# Patient Record
Sex: Female | Born: 1990 | Race: Black or African American | Hispanic: No | Marital: Single | Smoking: Never smoker
Health system: Southern US, Community
[De-identification: ages and names within clinical notes are randomized; demographics above are authoritative.]

## PROBLEM LIST (undated history)

## (undated) DIAGNOSIS — F419 Anxiety disorder, unspecified: Secondary | ICD-10-CM

## (undated) DIAGNOSIS — E039 Hypothyroidism, unspecified: Secondary | ICD-10-CM

## (undated) DIAGNOSIS — E559 Vitamin D deficiency, unspecified: Secondary | ICD-10-CM

## (undated) DIAGNOSIS — N83209 Unspecified ovarian cyst, unspecified side: Secondary | ICD-10-CM

## (undated) DIAGNOSIS — D219 Benign neoplasm of connective and other soft tissue, unspecified: Secondary | ICD-10-CM

## (undated) DIAGNOSIS — E785 Hyperlipidemia, unspecified: Secondary | ICD-10-CM

## (undated) HISTORY — DX: Benign neoplasm of connective and other soft tissue, unspecified: D21.9

## (undated) HISTORY — DX: Vitamin D deficiency, unspecified: E55.9

## (undated) HISTORY — DX: Anxiety disorder, unspecified: F41.9

## (undated) HISTORY — DX: Hypothyroidism, unspecified: E03.9

## (undated) HISTORY — DX: Hyperlipidemia, unspecified: E78.5

---

## 2019-09-01 HISTORY — PX: MYOMECTOMY: SHX85

## 2019-12-12 ENCOUNTER — Encounter (HOSPITAL_BASED_OUTPATIENT_CLINIC_OR_DEPARTMENT_OTHER): Payer: Self-pay | Admitting: *Deleted

## 2019-12-12 ENCOUNTER — Emergency Department (HOSPITAL_BASED_OUTPATIENT_CLINIC_OR_DEPARTMENT_OTHER)
Admission: EM | Admit: 2019-12-12 | Discharge: 2019-12-13 | Disposition: A | Discharge status: Home / Self Care | Payer: Self-pay | Attending: Emergency Medicine | Admitting: Emergency Medicine

## 2019-12-12 ENCOUNTER — Emergency Department (HOSPITAL_BASED_OUTPATIENT_CLINIC_OR_DEPARTMENT_OTHER): Payer: Self-pay

## 2019-12-12 ENCOUNTER — Other Ambulatory Visit: Payer: Self-pay

## 2019-12-12 DIAGNOSIS — D259 Leiomyoma of uterus, unspecified: Secondary | ICD-10-CM | POA: Insufficient documentation

## 2019-12-12 DIAGNOSIS — R102 Pelvic and perineal pain: Secondary | ICD-10-CM

## 2019-12-12 DIAGNOSIS — D219 Benign neoplasm of connective and other soft tissue, unspecified: Secondary | ICD-10-CM

## 2019-12-12 DIAGNOSIS — N946 Dysmenorrhea, unspecified: Secondary | ICD-10-CM | POA: Insufficient documentation

## 2019-12-12 HISTORY — DX: Unspecified ovarian cyst, unspecified side: N83.209

## 2019-12-12 LAB — URINALYSIS, MICROSCOPIC (REFLEX)

## 2019-12-12 LAB — URINALYSIS, ROUTINE W REFLEX MICROSCOPIC
Bilirubin Urine: NEGATIVE
Glucose, UA: NEGATIVE mg/dL
Ketones, ur: NEGATIVE mg/dL
Leukocytes,Ua: NEGATIVE
Nitrite: NEGATIVE
Protein, ur: NEGATIVE mg/dL
Specific Gravity, Urine: 1.025 (ref 1.005–1.030)
pH: 6 (ref 5.0–8.0)

## 2019-12-12 LAB — PREGNANCY, URINE: Preg Test, Ur: NEGATIVE

## 2019-12-12 MED ORDER — IBUPROFEN 800 MG PO TABS
800.0000 mg | ORAL_TABLET | Freq: Once | ORAL | Status: AC
  Administered 2019-12-12: 800 mg via ORAL
  Filled 2019-12-12: qty 1

## 2019-12-12 MED ORDER — ACETAMINOPHEN 500 MG PO TABS
1000.0000 mg | ORAL_TABLET | Freq: Once | ORAL | Status: AC
  Administered 2019-12-12: 1000 mg via ORAL
  Filled 2019-12-12: qty 2

## 2019-12-12 NOTE — ED Triage Notes (Signed)
Pt c/o left abd cramping x 3 days , HX ovarian cyst

## 2019-12-13 MED ORDER — IBUPROFEN 800 MG PO TABS: 800.0000 mg | ORAL_TABLET | Freq: Three times a day (TID) | ORAL | 0 refills | Status: DC

## 2019-12-13 NOTE — ED Provider Notes (Signed)
[facility] Provider Note   CSN: [id number] Arrival date & time: 12/12/19  2117     History Chief Complaint  Patient presents with  . Abdominal Cramping    Margaret Franklin is a 29 y.o. female.  The history is provided by the patient.  Abdominal Cramping This is a new problem. The current episode started more than 2 days ago. The problem occurs constantly. The problem has not changed since onset.Pertinent negatives include no chest pain, no abdominal pain, no headaches and no shortness of breath. Nothing aggravates the symptoms. Nothing relieves the symptoms. She has tried nothing for the symptoms. The treatment provided no relief.  Pelvic pain and cramping since starting her menstrual cycle.  No f/c/r.  No discharge.       Past Medical History:  Diagnosis Date  . Ovarian cyst     There are no problems to display for this patient.   History reviewed. No pertinent surgical history.   OB History   No obstetric history on file.     No family history on file.  Social History   Tobacco Use  . Smoking status: Never Smoker  Substance Use Topics  . Alcohol use: Not Currently  . Drug use: Not Currently    Home Medications Prior to Admission medications   Medication Sig Start Date End Date Taking? Authorizing Provider  ibuprofen (ADVIL) 800 MG tablet Take 1 tablet (800 mg total) by mouth 3 (three) times daily. 12/13/19   Stayce Delancy, MD    Allergies    Patient has no known allergies.  Review of Systems   Review of Systems  Constitutional: Negative for fever.  HENT: Negative for congestion.   Eyes: Negative for visual disturbance.  Respiratory: Negative for shortness of breath.   Cardiovascular: Negative for chest pain.  Gastrointestinal: Negative for abdominal pain.  Genitourinary: Positive for pelvic pain and vaginal bleeding. Negative for vaginal discharge.  Musculoskeletal: Negative for back pain.  Neurological: Negative for  headaches.  Psychiatric/Behavioral: Negative for agitation.  All other systems reviewed and are negative.   Physical Exam Updated Vital Signs BP 125/70 (BP Location: Right Arm)   Pulse (!) 108   Temp 98.2 F (36.8 C) (Oral)   Resp 19   Ht 5\' 6"  (1.676 m)   Wt 59 kg   LMP 12/10/2019   SpO2 96%   BMI 20.98 kg/m   Physical Exam Vitals and nursing note reviewed.  Constitutional:      Appearance: Normal appearance. She is not ill-appearing.  HENT:     Head: Normocephalic.     Nose: Nose normal.  Eyes:     Conjunctiva/sclera: Conjunctivae normal.     Pupils: Pupils are equal, round, and reactive to light.  Cardiovascular:     Rate and Rhythm: Normal rate and regular rhythm.     Pulses: Normal pulses.     Heart sounds: Normal heart sounds.  Pulmonary:     Effort: Pulmonary effort is normal.     Breath sounds: Normal breath sounds.  Abdominal:     General: Abdomen is flat. Bowel sounds are normal.     Tenderness: There is abdominal tenderness. There is no guarding or rebound.     Hernia: No hernia is present.  Genitourinary:    General: Normal vulva.     Comments: Chaperone present scant bleeding no CMT Musculoskeletal:        General: Normal range of motion.     Cervical back: Normal range of  motion and neck supple.  Skin:    General: Skin is warm and dry.     Capillary Refill: Capillary refill takes less than 2 seconds.  Neurological:     General: No focal deficit present.     Mental Status: She is alert and oriented to person, place, and time.  Psychiatric:        Mood and Affect: Mood normal.        Behavior: Behavior normal.     ED Results / Procedures / Treatments   Labs (all labs ordered are listed, but only abnormal results are displayed) Results for orders placed or performed during the hospital encounter of 12/12/19  Urinalysis, Routine w reflex microscopic  Result Value Ref Range   Color, Urine YELLOW YELLOW   APPearance CLEAR CLEAR   Specific  Gravity, Urine 1.025 1.005 - 1.030   pH 6.0 5.0 - 8.0   Glucose, UA NEGATIVE NEGATIVE mg/dL   Hgb urine dipstick LARGE (A) NEGATIVE   Bilirubin Urine NEGATIVE NEGATIVE   Ketones, ur NEGATIVE NEGATIVE mg/dL   Protein, ur NEGATIVE NEGATIVE mg/dL   Nitrite NEGATIVE NEGATIVE   Leukocytes,Ua NEGATIVE NEGATIVE  Pregnancy, urine  Result Value Ref Range   Preg Test, Ur NEGATIVE NEGATIVE  Urinalysis, Microscopic (reflex)  Result Value Ref Range   RBC / HPF 21-50 0 - 5 RBC/hpf   WBC, UA 0-5 0 - 5 WBC/hpf   Bacteria, UA FEW (A) NONE SEEN   Squamous Epithelial / LPF 0-5 0 - 5   Mucus PRESENT    US PELVIC COMPLETE W TRANSVAGINAL AND TORSION R/O  Result Date: 12/12/2019 CLINICAL DATA:  Pelvic cramping with left pelvic pain x1 week. EXAM: TRANSABDOMINAL AND TRANSVAGINAL ULTRASOUND OF PELVIS TECHNIQUE: Both transabdominal and transvaginal ultrasound examinations of the pelvis were performed. Transabdominal technique was performed for global imaging of the pelvis including uterus, ovaries, adnexal regions, and pelvic cul-de-sac. It was necessary to proceed with endovaginal exam following the transabdominal exam to visualize the uterus, endometrium and bilateral ovaries. COMPARISON:  None FINDINGS: Uterus Measurements: 7.8 cm x 4.8 cm x 5.5 cm = volume: 108 mL. A 4.7 cm x 3.8 cm x 3.7 cm heterogeneous echogenic mass is seen within the anterior aspect of the uterus on the left. No abnormal flow is seen within this region on color Doppler evaluation. Endometrium Thickness: 4.6 mm.  No focal abnormality visualized. Right ovary Measurements: 5.1 cm x 2.5 cm x 3.0 cm = volume: 19.8 mL. Multiple right ovarian cysts of various sizes are seen (ultrasonographic measurements not provided). Left ovary Measurements: 3.4 cm x 1.7 cm x 1.7 cm = volume: 5.2 mL. Normal appearance/no adnexal mass. Other findings A small amount of pelvic free fluid is seen. IMPRESSION: Large heterogeneous uterine mass which may represent a large  uterine fibroid. Further evaluation with pelvic MRI is recommended. Electronically Signed   By: Virgina Norfolk M.D.   On: 12/12/2019 22:23    Radiology US PELVIC COMPLETE W TRANSVAGINAL AND TORSION R/O  Result Date: 12/12/2019 CLINICAL DATA:  Pelvic cramping with left pelvic pain x1 week. EXAM: TRANSABDOMINAL AND TRANSVAGINAL ULTRASOUND OF PELVIS TECHNIQUE: Both transabdominal and transvaginal ultrasound examinations of the pelvis were performed. Transabdominal technique was performed for global imaging of the pelvis including uterus, ovaries, adnexal regions, and pelvic cul-de-sac. It was necessary to proceed with endovaginal exam following the transabdominal exam to visualize the uterus, endometrium and bilateral ovaries. COMPARISON:  None FINDINGS: Uterus Measurements: 7.8 cm x 4.8 cm x 5.5 cm =  volume: 108 mL. A 4.7 cm x 3.8 cm x 3.7 cm heterogeneous echogenic mass is seen within the anterior aspect of the uterus on the left. No abnormal flow is seen within this region on color Doppler evaluation. Endometrium Thickness: 4.6 mm.  No focal abnormality visualized. Right ovary Measurements: 5.1 cm x 2.5 cm x 3.0 cm = volume: 19.8 mL. Multiple right ovarian cysts of various sizes are seen (ultrasonographic measurements not provided). Left ovary Measurements: 3.4 cm x 1.7 cm x 1.7 cm = volume: 5.2 mL. Normal appearance/no adnexal mass. Other findings A small amount of pelvic free fluid is seen. IMPRESSION: Large heterogeneous uterine mass which may represent a large uterine fibroid. Further evaluation with pelvic MRI is recommended. Electronically Signed   By: Virgina Norfolk M.D.   On: 12/12/2019 22:23    Procedures Procedures (including critical care time)  Medications Ordered in ED Medications  acetaminophen (TYLENOL) tablet 1,000 mg (1,000 mg Oral Given 12/12/19 2355)  ibuprofen (ADVIL) tablet 800 mg (800 mg Oral Given 12/12/19 2355)    ED Course  I have reviewed the triage vital signs and  the nursing notes.  Pertinent labs & imaging results that were available during my care of the patient were reviewed by me and considered in my medical decision making (see chart for details).    Cramping is likely secondary to period as well as fibroid seen on [place].  I have explained that the radiologist recommends follow up with GYN and pelvic MRI to better characterize this lesion.  I have also printed this information on her discharge paperwork.  Patient verbalizes understanding.  Margaret Franklin was evaluated in Emergency Department on 12/13/2019 for the symptoms described in the history of present illness. She was evaluated in the context of the global COVID-19 pandemic, which necessitated consideration that the patient might be at risk for infection with the SARS-CoV-2 virus that causes COVID-19. Institutional protocols and algorithms that pertain to the evaluation of patients at risk for COVID-19 are in a state of rapid change based on information released by regulatory bodies including the CDC and federal and state organizations. These policies and algorithms were followed during the patient's care in the ED.    Final Clinical Impression(s) / ED Diagnoses Final diagnoses:  Fibroid  Dysmenorrhea   Return for weakness, numbness, changes in vision or speech, fevers >100.4 unrelieved by medication, shortness of breath, intractable vomiting, or diarrhea, abdominal pain, Inability to tolerate liquids or food, cough, altered mental status or any concerns. No signs of systemic illness or infection. The patient is nontoxic-appearing on exam and vital signs are within normal limits.   I have reviewed the triage vital signs and the nursing notes. Pertinent labs &imaging results that were available during my care of the patient were reviewed by me and considered in my medical decision making (see chart for details).  After history, exam, and medical workup I feel the patient has been appropriately  medically screened and is safe for discharge home. Pertinent diagnoses were discussed with the patient. Patient was givenstrictreturn precautions. Rx / DC Orders ED Discharge Orders         Ordered    ibuprofen (ADVIL) 800 MG tablet  3 times daily     12/13/19 0037           Hannia Matchett, MD 12/13/19 [id number]

## 2019-12-13 NOTE — ED Notes (Signed)
PT instructed on how to f/u with MRI of uterus at [facility] clinic. PT verbalized understanding.

## 2020-01-01 ENCOUNTER — Emergency Department (HOSPITAL_BASED_OUTPATIENT_CLINIC_OR_DEPARTMENT_OTHER)
Admission: EM | Admit: 2020-01-01 | Discharge: 2020-01-01 | Disposition: A | Discharge status: Home / Self Care | Payer: BLUE CROSS/BLUE SHIELD | Attending: Emergency Medicine | Admitting: Emergency Medicine

## 2020-01-01 ENCOUNTER — Emergency Department (HOSPITAL_BASED_OUTPATIENT_CLINIC_OR_DEPARTMENT_OTHER): Payer: BLUE CROSS/BLUE SHIELD

## 2020-01-01 ENCOUNTER — Encounter (HOSPITAL_BASED_OUTPATIENT_CLINIC_OR_DEPARTMENT_OTHER): Payer: Self-pay

## 2020-01-01 ENCOUNTER — Other Ambulatory Visit: Payer: Self-pay

## 2020-01-01 DIAGNOSIS — R102 Pelvic and perineal pain: Secondary | ICD-10-CM | POA: Diagnosis not present

## 2020-01-01 DIAGNOSIS — M549 Dorsalgia, unspecified: Secondary | ICD-10-CM | POA: Insufficient documentation

## 2020-01-01 DIAGNOSIS — R109 Unspecified abdominal pain: Secondary | ICD-10-CM | POA: Diagnosis present

## 2020-01-01 LAB — CBC WITH DIFFERENTIAL/PLATELET
Abs Immature Granulocytes: 0 10*3/uL (ref 0.00–0.07)
Basophils Absolute: 0 10*3/uL (ref 0.0–0.1)
Basophils Relative: 1 %
Eosinophils Absolute: 0 10*3/uL (ref 0.0–0.5)
Eosinophils Relative: 1 %
HCT: 36 % (ref 36.0–46.0)
Hemoglobin: 11.5 g/dL — ABNORMAL LOW (ref 12.0–15.0)
Immature Granulocytes: 0 %
Lymphocytes Relative: 41 %
Lymphs Abs: 1.4 10*3/uL (ref 0.7–4.0)
MCH: 25.2 pg — ABNORMAL LOW (ref 26.0–34.0)
MCHC: 31.9 g/dL (ref 30.0–36.0)
MCV: 78.9 fL — ABNORMAL LOW (ref 80.0–100.0)
Monocytes Absolute: 0.4 10*3/uL (ref 0.1–1.0)
Monocytes Relative: 13 %
Neutro Abs: 1.4 10*3/uL — ABNORMAL LOW (ref 1.7–7.7)
Neutrophils Relative %: 44 %
Platelets: 354 10*3/uL (ref 150–400)
RBC: 4.56 MIL/uL (ref 3.87–5.11)
RDW: 14 % (ref 11.5–15.5)
WBC: 3.3 10*3/uL — ABNORMAL LOW (ref 4.0–10.5)
nRBC: 0 % (ref 0.0–0.2)

## 2020-01-01 LAB — URINALYSIS, MICROSCOPIC (REFLEX)

## 2020-01-01 LAB — URINALYSIS, ROUTINE W REFLEX MICROSCOPIC
Bilirubin Urine: NEGATIVE
Glucose, UA: NEGATIVE mg/dL
Ketones, ur: NEGATIVE mg/dL
Leukocytes,Ua: NEGATIVE
Nitrite: NEGATIVE
Protein, ur: 30 mg/dL — AB
Specific Gravity, Urine: 1.02 (ref 1.005–1.030)
pH: 7.5 (ref 5.0–8.0)

## 2020-01-01 LAB — BASIC METABOLIC PANEL
Anion gap: 10 (ref 5–15)
BUN: 7 mg/dL (ref 6–20)
CO2: 23 mmol/L (ref 22–32)
Calcium: 9.3 mg/dL (ref 8.9–10.3)
Chloride: 105 mmol/L (ref 98–111)
Creatinine, Ser: 0.67 mg/dL (ref 0.44–1.00)
GFR calc Af Amer: >60 mL/min (ref >60)
GFR calc non Af Amer: >60 mL/min (ref >60)
Glucose, Bld: 94 mg/dL (ref 70–99)
Potassium: 3.6 mmol/L (ref 3.5–5.1)
Sodium: 138 mmol/L (ref 135–145)

## 2020-01-01 LAB — PREGNANCY, URINE: Preg Test, Ur: NEGATIVE

## 2020-01-01 MED ORDER — MORPHINE SULFATE (PF) 4 MG/ML IV SOLN
4.0000 mg | Freq: Once | INTRAVENOUS | Status: AC
  Administered 2020-01-01: 13:00:00 4 mg via INTRAVENOUS
  Filled 2020-01-01: qty 1

## 2020-01-01 MED ORDER — KETOROLAC TROMETHAMINE 30 MG/ML IJ SOLN
30.0000 mg | Freq: Once | INTRAMUSCULAR | Status: AC
  Administered 2020-01-01: 13:00:00 30 mg via INTRAVENOUS
  Filled 2020-01-01: qty 1

## 2020-01-01 MED ORDER — HYDROCODONE-ACETAMINOPHEN 5-325 MG PO TABS: 1.0000 | ORAL_TABLET | Freq: Four times a day (QID) | ORAL | 0 refills | Status: DC | PRN

## 2020-01-01 NOTE — ED Triage Notes (Signed)
Pt arrives with abdominal cramping has recently been seen here and did follow up with [facility]. Pt has apt tomorrow but states the pain is not getting better. She has stopped taking her birth control to try and ease pain, has been using tylenol, and ibuprofen with no relief. Pt reports she has also had irregularity in her menstrual cycle.

## 2020-01-01 NOTE — Discharge Instructions (Addendum)
You were seen in the emergency department for worsening pelvic pain.  You had a CAT scan along with blood work and urinalysis that did not show any serious findings.  Please follow-up with your gynecologist tomorrow as scheduled.  We are providing you a short course of some pain medicine.  Return to the emergency department if any worsening or concerning symptoms

## 2020-01-01 NOTE — ED Provider Notes (Signed)
[facility] EMERGENCY DEPARTMENT Provider Note   CSN: [id number] Arrival date & time: 01/01/20  1204     History Chief Complaint  Patient presents with  . Abdominal Pain    Margaret Franklin is a 29 y.o. female.  She has a history of chronic pelvic pain that is been going on for for 5 years.  She was here last month for same and had an ultrasound that showed a uterine fibroid.  She is followed up with GYN and actually has an appointment again with them tomorrow.  She is using Tylenol and ibuprofen without improvement.  Complaining of stabbing severe low abdominal pain radiating through to her back mostly on the left side.  Not associate with any urinary symptoms.  Not having any vaginal bleeding or discharge now.  Stopped her oral contraceptives without any improvement in her symptoms.  She said she gets this pain all the time but it is worse when she has her cycle.  The history is provided by the patient.  Abdominal Pain Pain location:  Suprapubic and LLQ Pain quality: stabbing   Pain radiates to:  Back Pain severity:  Severe Onset quality:  Gradual Timing:  Intermittent Progression:  Unchanged Chronicity:  Chronic Context: not recent travel, not sick contacts and not trauma   Relieved by:  Nothing Worsened by:  Nothing Ineffective treatments:  Acetaminophen and NSAIDs Associated symptoms: no chest pain, no constipation, no cough, no diarrhea, no dysuria, no fever, no hematemesis, no hematochezia, no hematuria, no nausea, no shortness of breath, no sore throat, no vaginal bleeding, no vaginal discharge and no vomiting        Past Medical History:  Diagnosis Date  . Ovarian cyst     There are no problems to display for this patient.   History reviewed. No pertinent surgical history.   OB History   No obstetric history on file.     No family history on file.  Social History   Tobacco Use  . Smoking status: Never Smoker  . Smokeless tobacco: Never Used    Substance Use Topics  . Alcohol use: Not Currently  . Drug use: Not Currently    Home Medications Prior to Admission medications   Medication Sig Start Date End Date Taking? Authorizing Provider  ibuprofen (ADVIL) 800 MG tablet Take 1 tablet (800 mg total) by mouth 3 (three) times daily. 12/13/19   Palumbo, April, MD    Allergies    Patient has no known allergies.  Review of Systems   Review of Systems  Constitutional: Negative for fever.  HENT: Negative for sore throat.   Eyes: Negative for visual disturbance.  Respiratory: Negative for cough and shortness of breath.   Cardiovascular: Negative for chest pain.  Gastrointestinal: Positive for abdominal pain. Negative for constipation, diarrhea, hematemesis, hematochezia, nausea and vomiting.  Genitourinary: Negative for dysuria, hematuria, vaginal bleeding and vaginal discharge.  Musculoskeletal: Positive for back pain.  Skin: Negative for rash.  Neurological: Negative for headaches.    Physical Exam Updated Vital Signs BP 133/83 (BP Location: Right Arm)   Pulse 87   Temp 98 F (36.7 C) (Oral)   Resp 18   Ht 5\' 6"  (1.676 m)   Wt 59 kg   LMP 12/25/2019   SpO2 99%   BMI 20.98 kg/m   Physical Exam Vitals and nursing note reviewed.  Constitutional:      General: She is not in acute distress.    Appearance: She is well-developed.  HENT:  Head: Normocephalic and atraumatic.  Eyes:     Conjunctiva/sclera: Conjunctivae normal.  Cardiovascular:     Rate and Rhythm: Normal rate and regular rhythm.     Heart sounds: No murmur.  Pulmonary:     Effort: Pulmonary effort is normal. No respiratory distress.     Breath sounds: Normal breath sounds.  Abdominal:     General: Abdomen is flat.     Palpations: Abdomen is soft.     Tenderness: There is abdominal tenderness in the suprapubic area and left lower quadrant.  Musculoskeletal:     Cervical back: Neck supple.  Skin:    General: Skin is warm and dry.      Capillary Refill: Capillary refill takes less than 2 seconds.  Neurological:     General: No focal deficit present.     Mental Status: She is alert.     Gait: Gait normal.     ED Results / Procedures / Treatments   Labs (all labs ordered are listed, but only abnormal results are displayed) Labs Reviewed  CBC WITH DIFFERENTIAL/PLATELET - Abnormal; Notable for the following components:      Result Value   WBC 3.3 (*)    Hemoglobin 11.5 (*)    MCV 78.9 (*)    MCH 25.2 (*)    Neutro Abs 1.4 (*)    All other components within normal limits  URINALYSIS, ROUTINE W REFLEX MICROSCOPIC - Abnormal; Notable for the following components:   APPearance HAZY (*)    Hgb urine dipstick TRACE (*)    Protein, ur 30 (*)    All other components within normal limits  URINALYSIS, MICROSCOPIC (REFLEX) - Abnormal; Notable for the following components:   Bacteria, UA FEW (*)    All other components within normal limits  BASIC METABOLIC PANEL  PREGNANCY, URINE    EKG None  Radiology CT Renal Stone Study  Result Date: 01/01/2020 CLINICAL DATA:  Left flank pain EXAM: CT ABDOMEN AND PELVIS WITHOUT CONTRAST TECHNIQUE: Multidetector CT imaging of the abdomen and pelvis was performed following the standard protocol without IV contrast. COMPARISON:  Correlation made with ultrasound pelvis 12/12/2019 FINDINGS: Lower chest: No acute abnormality. Hepatobiliary: No focal liver abnormality is seen. No gallstones, gallbladder wall thickening, or biliary dilatation. Pancreas: Unremarkable. Spleen: Unremarkable. Adrenals/Urinary Tract: Unremarkable.  Would no calculi identified. Stomach/Bowel: Unremarkable. Bowel is normal in caliber and appendix is normal. Vascular/Lymphatic: No significant vascular findings on this noncontrast study. There are no enlarged lymph nodes identified. Reproductive: Endometrial canal appears somewhat displaced likely reflecting mass seen on prior ultrasound. Adnexa are unremarkable. Other: No  ascites.  Abdominal wall is unremarkable. Musculoskeletal: No significant or acute osseous abnormality. IMPRESSION: Displacement of the endometrial canal likely reflecting mass seen on prior ultrasound. Otherwise unremarkable study. Electronically Signed   By: Macy Mis M.D.   On: 01/01/2020 14:06    Procedures Procedures (including critical care time)  Medications Ordered in ED Medications  ketorolac (TORADOL) 30 MG/ML injection 30 mg (30 mg Intravenous Given 01/01/20 1241)  morphine 4 MG/ML injection 4 mg (4 mg Intravenous Given 01/01/20 1243)    ED Course  I have reviewed the triage vital signs and the nursing notes.  Pertinent labs & imaging results that were available during my care of the patient were reviewed by me and considered in my medical decision making (see chart for details).    MDM Rules/Calculators/A&P  This patient complains of suprapubic abdominal pain; this involves an extensive number of treatment Options and is a complaint that carries with it a high risk of complications and Morbidity. The differential includes UTI pregnancy, ectopic, endometriosis uterine fibroids, torsion, kidney stone  I ordered, reviewed and interpreted labs, which included white count low 3.3 unclear significance, hemoglobin at baseline, chemistries normal, pregnancy test negative, urinalysis equivocal with 6-10 reds but 11-20 squames likely contaminant I ordered medication morphine and Toradol I ordered imaging studies which included CT KUB and I independently    visualized and interpreted imaging which showed no kidney stones identified. Previous records obtained and reviewed in epic including her last ED visit here and subsequent visits to gynecology for work-up of her symptoms  After the interventions stated above, I reevaluated the patient and found patient's pain to be improved.  She has a follow-up appoint with gynecology tomorrow.  About her short course of some  pain medicine.  Indications for return discussed   Final Clinical Impression(s) / ED Diagnoses Final diagnoses:  Pelvic pain in female    Rx / DC Orders ED Discharge Orders         Ordered    HYDROcodone-acetaminophen (NORCO/VICODIN) 5-325 MG tablet  Every 6 hours PRN     01/01/20 1421           Hayden Rasmussen, MD 01/01/20 1652

## 2020-01-01 NOTE — ED Notes (Signed)
ED Provider at bedside. 

## 2020-01-29 ENCOUNTER — Emergency Department (HOSPITAL_BASED_OUTPATIENT_CLINIC_OR_DEPARTMENT_OTHER)
Admission: EM | Admit: 2020-01-29 | Discharge: 2020-01-29 | Disposition: A | Discharge status: Home / Self Care | Payer: BLUE CROSS/BLUE SHIELD | Attending: Emergency Medicine | Admitting: Emergency Medicine

## 2020-01-29 ENCOUNTER — Encounter (HOSPITAL_BASED_OUTPATIENT_CLINIC_OR_DEPARTMENT_OTHER): Payer: Self-pay | Admitting: *Deleted

## 2020-01-29 ENCOUNTER — Other Ambulatory Visit: Payer: Self-pay

## 2020-01-29 DIAGNOSIS — Z79899 Other long term (current) drug therapy: Secondary | ICD-10-CM | POA: Diagnosis not present

## 2020-01-29 DIAGNOSIS — R1032 Left lower quadrant pain: Secondary | ICD-10-CM | POA: Insufficient documentation

## 2020-01-29 DIAGNOSIS — R110 Nausea: Secondary | ICD-10-CM | POA: Diagnosis not present

## 2020-01-29 DIAGNOSIS — R1030 Lower abdominal pain, unspecified: Secondary | ICD-10-CM

## 2020-01-29 LAB — URINALYSIS, ROUTINE W REFLEX MICROSCOPIC
Bilirubin Urine: NEGATIVE
Glucose, UA: NEGATIVE mg/dL
Ketones, ur: 15 mg/dL — AB
Leukocytes,Ua: NEGATIVE
Nitrite: NEGATIVE
Protein, ur: 100 mg/dL — AB
Specific Gravity, Urine: >1.03 — ABNORMAL HIGH (ref 1.005–1.030)
pH: 6 (ref 5.0–8.0)

## 2020-01-29 LAB — CBC WITH DIFFERENTIAL/PLATELET
Abs Immature Granulocytes: 0.02 10*3/uL (ref 0.00–0.07)
Basophils Absolute: 0.1 10*3/uL (ref 0.0–0.1)
Basophils Relative: 1 %
Eosinophils Absolute: 0 10*3/uL (ref 0.0–0.5)
Eosinophils Relative: 0 %
HCT: 36.4 % (ref 36.0–46.0)
Hemoglobin: 11.8 g/dL — ABNORMAL LOW (ref 12.0–15.0)
Immature Granulocytes: 0 %
Lymphocytes Relative: 15 %
Lymphs Abs: 1.3 10*3/uL (ref 0.7–4.0)
MCH: 25.8 pg — ABNORMAL LOW (ref 26.0–34.0)
MCHC: 32.4 g/dL (ref 30.0–36.0)
MCV: 79.5 fL — ABNORMAL LOW (ref 80.0–100.0)
Monocytes Absolute: 0.7 10*3/uL (ref 0.1–1.0)
Monocytes Relative: 8 %
Neutro Abs: 6.5 10*3/uL (ref 1.7–7.7)
Neutrophils Relative %: 76 %
Platelets: 395 10*3/uL (ref 150–400)
RBC: 4.58 MIL/uL (ref 3.87–5.11)
RDW: 14.3 % (ref 11.5–15.5)
WBC: 8.6 10*3/uL (ref 4.0–10.5)
nRBC: 0 % (ref 0.0–0.2)

## 2020-01-29 LAB — URINALYSIS, MICROSCOPIC (REFLEX)

## 2020-01-29 LAB — COMPREHENSIVE METABOLIC PANEL
ALT: 16 U/L (ref 0–44)
AST: 18 U/L (ref 15–41)
Albumin: 4.7 g/dL (ref 3.5–5.0)
Alkaline Phosphatase: 60 U/L (ref 38–126)
Anion gap: 11 (ref 5–15)
BUN: 7 mg/dL (ref 6–20)
CO2: 22 mmol/L (ref 22–32)
Calcium: 9.3 mg/dL (ref 8.9–10.3)
Chloride: 104 mmol/L (ref 98–111)
Creatinine, Ser: 0.76 mg/dL (ref 0.44–1.00)
GFR calc Af Amer: >60 mL/min (ref >60)
GFR calc non Af Amer: >60 mL/min (ref >60)
Glucose, Bld: 78 mg/dL (ref 70–99)
Potassium: 3.4 mmol/L — ABNORMAL LOW (ref 3.5–5.1)
Sodium: 137 mmol/L (ref 135–145)
Total Bilirubin: 0.8 mg/dL (ref 0.3–1.2)
Total Protein: 9.4 g/dL — ABNORMAL HIGH (ref 6.5–8.1)

## 2020-01-29 MED ORDER — MORPHINE SULFATE (PF) 4 MG/ML IV SOLN
4.0000 mg | Freq: Once | INTRAVENOUS | Status: AC
  Administered 2020-01-29: 4 mg via INTRAMUSCULAR
  Filled 2020-01-29: qty 1

## 2020-01-29 MED ORDER — HYDROCODONE-ACETAMINOPHEN 5-325 MG PO TABS: 1.0000 | ORAL_TABLET | Freq: Once | ORAL | Status: DC

## 2020-01-29 NOTE — Discharge Instructions (Addendum)
Your laboratory results were within normal limits.   You will need to follow-up with your OB/GYN in order to obtain further management of your pelvic pain.

## 2020-01-29 NOTE — ED Triage Notes (Signed)
Abdominal pain for a year. No diagnosis has been found.

## 2020-01-29 NOTE — ED Provider Notes (Signed)
[facility] EMERGENCY DEPARTMENT Provider Note   CSN: [id number] Arrival date & time: 01/29/20  1557     History Chief Complaint  Patient presents with  . Abdominal Pain    Lien Novitsky is a 29 y.o. female.  29 y.o female with a PMH of ovarian cyst presents to the ED with constant abdominal cramping ongoing since 2016.  Reports she has been seen in the ED multiple times, and had follow-up with OB/GYN however after colposcopy she has not been informed of the results.  Reports her pain has not been kept under control at home with ibuprofen. States the pain is more severe today.Also endorses nausea. Alleviates with sitting on the toilet. Last bowel movement was yesterday, without blood in stool. Her menstrual period began yesterday. No other complaints.   The history is provided by the patient.  Abdominal Pain Pain location:  LLQ Pain quality: sharp and throbbing   Pain radiates to:  Back Pain severity:  Severe Onset quality:  Gradual Timing:  Constant Progression:  Unchanged Chronicity:  Chronic Context: not previous surgeries   Relieved by:  Nothing Worsened by:  Movement Ineffective treatments:  Acetaminophen and OTC medications Associated symptoms: nausea and vaginal bleeding   Associated symptoms: no chills, no constipation, no diarrhea, no fever, no shortness of breath and no vaginal discharge   Risk factors: has not had multiple surgeries and not pregnant        Past Medical History:  Diagnosis Date  . Ovarian cyst     There are no problems to display for this patient.   History reviewed. No pertinent surgical history.   OB History   No obstetric history on file.     No family history on file.  Social History   Tobacco Use  . Smoking status: Never Smoker  . Smokeless tobacco: Never Used  Substance Use Topics  . Alcohol use: Not Currently  . Drug use: Not Currently    Home Medications Prior to Admission medications   Medication Sig  Start Date End Date Taking? Authorizing Provider  ibuprofen (ADVIL) 800 MG tablet Take 1 tablet (800 mg total) by mouth 3 (three) times daily. 12/13/19  Yes Palumbo, April, MD  HYDROcodone-acetaminophen (NORCO/VICODIN) 5-325 MG tablet Take 1-2 tablets by mouth every 6 (six) hours as needed for severe pain. 01/01/20   Hayden Rasmussen, MD    Allergies    Patient has no known allergies.  Review of Systems   Review of Systems  Constitutional: Negative for chills and fever.  Respiratory: Negative for shortness of breath.   Gastrointestinal: Positive for abdominal pain and nausea. Negative for constipation and diarrhea.  Genitourinary: Positive for vaginal bleeding. Negative for vaginal discharge.    Physical Exam Updated Vital Signs BP 111/84   Pulse 61   Temp 98.2 F (36.8 C) (Oral)   Resp 18   Ht 5\' 6"  (1.676 m)   Wt 59 kg   LMP 01/28/2020   SpO2 100%   BMI 20.98 kg/m   Physical Exam Vitals and nursing note reviewed.  Constitutional:      General: She is not in acute distress.    Appearance: She is well-developed.  HENT:     Head: Normocephalic and atraumatic.     Mouth/Throat:     Pharynx: No oropharyngeal exudate.  Eyes:     Pupils: Pupils are equal, round, and reactive to light.  Cardiovascular:     Rate and Rhythm: Regular rhythm.     Heart  sounds: Normal heart sounds.  Pulmonary:     Effort: Pulmonary effort is normal. No respiratory distress.     Breath sounds: Normal breath sounds.  Abdominal:     General: Bowel sounds are normal. There is no distension.     Palpations: Abdomen is soft.     Tenderness: There is no abdominal tenderness. There is no right CVA tenderness or left CVA tenderness.     Hernia: No hernia is present.  Musculoskeletal:        General: No tenderness or deformity.     Cervical back: Normal range of motion.     Right lower leg: No edema.     Left lower leg: No edema.  Skin:    General: Skin is warm and dry.  Neurological:     Mental  Status: She is alert and oriented to person, place, and time.     ED Results / Procedures / Treatments   Labs (all labs ordered are listed, but only abnormal results are displayed) Labs Reviewed  CBC WITH DIFFERENTIAL/PLATELET - Abnormal; Notable for the following components:      Result Value   Hemoglobin 11.8 (*)    MCV 79.5 (*)    MCH 25.8 (*)    All other components within normal limits  COMPREHENSIVE METABOLIC PANEL - Abnormal; Notable for the following components:   Potassium 3.4 (*)    Total Protein 9.4 (*)    All other components within normal limits  URINALYSIS, ROUTINE W REFLEX MICROSCOPIC - Abnormal; Notable for the following components:   Specific Gravity, Urine >1.030 (*)    Hgb urine dipstick LARGE (*)    Ketones, ur 15 (*)    Protein, ur 100 (*)    All other components within normal limits  URINALYSIS, MICROSCOPIC (REFLEX) - Abnormal; Notable for the following components:   Bacteria, UA FEW (*)    All other components within normal limits  URINE CULTURE  PREGNANCY, URINE    EKG None  Radiology No results found.  Procedures Procedures (including critical care time)  Medications Ordered in ED Medications  HYDROcodone-acetaminophen (NORCO/VICODIN) 5-325 MG per tablet 1 tablet (1 tablet Oral Refused 01/29/20 1845)  morphine 4 MG/ML injection 4 mg (4 mg Intramuscular Given 01/29/20 1847)    ED Course  I have reviewed the triage vital signs and the nursing notes.  Pertinent labs & imaging results that were available during my care of the patient were reviewed by me and considered in my medical decision making (see chart for details).    MDM Rules/Calculators/A&P  Patient with a past medical history of ovarian cyst presents to the ED with complaints of left lower quadrant pain constant since 2016, proximately 5 years ago.  Recently seen by OB/GYN on Jan 01, 2020, had a colposcopy for ASCUS with positive high risk HPV by Dr. Aggie Moats. No results are visible  to me after chart review.  I have extensively reviewed patient's chart, I do see a telephone encounter with patient continue to experience pelvic pain, she was originally taking hydrocodone 5 mg for pain control. Reports pain has been out of control the past few weeks.Attempted to provide patient with pain medication but patient refused this as she states "it doesn't work", she does not have an IV therefore medication held pending labs.   Extensive review of her chart with multiple doses to the ED.  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 Further evaluation with pelvic MRI is recommended.  Patient was seen by OB/GYN after this visit, did have colposcopy, however I am unable to review these results as they are not found on her chart.  We discussed following up with OB/GYN in order to obtain further results. Second imaging of CT renal earlier this month which showed displacement of the endometrial canal likely reflect on the mass.  I have reviewed that her OB/GYN order an MRI of the pelvis with and without contrast on an outpatient basis.  Interpretation of her blood work revealed no leukocytosis, hemoglobin slightly decreased but patient is currently on her menstrual cycle.  CMP with mild hypokalemia but no other electrolyte derangement.  LFTs are within normal limits.  Creatinine level is unremarkable.  UA with few bacteria, nitrites or leukocytes.  6:40 PM Patient's results explained to her at length.  We discussed hydrocodone, she reported medications not work.  Would rather have morphine.  I discussed the Murvin Natal control with morphine IM.  Patient is agreeable of this at this time.  She is encouraged to follow-up with OB/GYN as they have an MRI of her pelvis scheduled.  Unfortunately, no MRI is available at this facility today, we discussed that this would be easier to obtain on an outpatient basis.  She does not have  any acute pathology on today's visit.  7:59 PM patient was reevaluated by me after seeing IM morphine.  Reports this did not help with symptoms.  She reports that last medication she was given the ED helped relieve her pain, I extensively review her chart she was given Norco and prior to the visit ibuprofen and Tylenol.  Discussed following up with OB/GYN who has results of her colposcopy.  Patient is agreeable of therapy at this time, discharged in stable condition.   Portions of this note were generated with Lobbyist. Dictation errors may occur despite best attempts at proofreading.  Final Clinical Impression(s) / ED Diagnoses Final diagnoses:  Lower abdominal pain    Rx / DC Orders ED Discharge Orders    None       Janeece Fitting, PA-C 01/29/20 George, Mead, DO 01/29/20 2133

## 2020-01-31 LAB — URINE CULTURE: Culture: 20000 — AB

## 2020-02-01 ENCOUNTER — Telehealth: Payer: Self-pay

## 2020-02-01 MED ORDER — SODIUM CHLORIDE FLUSH 0.9 % IV SOLN
5.00 | INTRAVENOUS | Status: DC
Start: 2020-02-01 — End: 2020-02-01

## 2020-02-01 MED ORDER — SODIUM CHLORIDE FLUSH 0.9 % IV SOLN
5.00 | INTRAVENOUS | Status: DC
Start: ? — End: 2020-02-01

## 2020-02-01 NOTE — Telephone Encounter (Signed)
Post ED Visit - Positive Culture Follow-up  Culture report reviewed by antimicrobial stewardship pharmacist: [place] Team []  Elenor Quinones, Pharm.D. []  Heide Guile, Pharm.D., BCPS AQ-ID []  Parks Neptune, Pharm.D., BCPS []  Alycia Rossetti, Pharm.D., BCPS []  Chauncey, Pharm.D., BCPS, AAHIVP []  Legrand Como, Pharm.D., BCPS, AAHIVP []  Salome Arnt, PharmD, BCPS []  Johnnette Gourd, PharmD, BCPS []  Hughes Better, PharmD, BCPS []  Leeroy Cha, PharmD []  Laqueta Linden, PharmD, BCPS []  Albertina Parr, PharmD [place] Team []  Leodis Sias, PharmD []  Lindell Spar, PharmD []  Royetta Asal, PharmD []  Graylin Shiver, Rph []  Rema Fendt) Glennon Mac, PharmD []  Arlyn Dunning, PharmD []  Netta Cedars, PharmD []  Dia Sitter, PharmD []  Leone Haven, PharmD []  Gretta Arab, PharmD []  Theodis Shove, PharmD []  Peggyann Juba, PharmD []  Reuel Boom, PharmD   Positive urine culture  no treatment, and no further patient follow-up is required at this time.  Genia Del 02/01/2020, 9:36 AM

## 2021-08-31 HISTORY — PX: OTHER SURGICAL HISTORY: SHX169

## 2022-02-21 IMAGING — CT CT RENAL STONE PROTOCOL
2 of 4 series · 16 of 46 positions shown, 18 images · non-contrast
Comparison: Correlation made with ultrasound pelvis 12/12/2019

CLINICAL DATA: Left flank pain

EXAM:
CT ABDOMEN AND PELVIS WITHOUT CONTRAST
TECHNIQUE: Multidetector CT imaging of the abdomen and pelvis was performed
following the standard protocol without IV contrast.

[Series 2: axial st · axial · 0.73mm/px · z∈[-454,-59]mm · 13 of 87 slices shown, 15 images]
[im 4/87  soft-tissue]
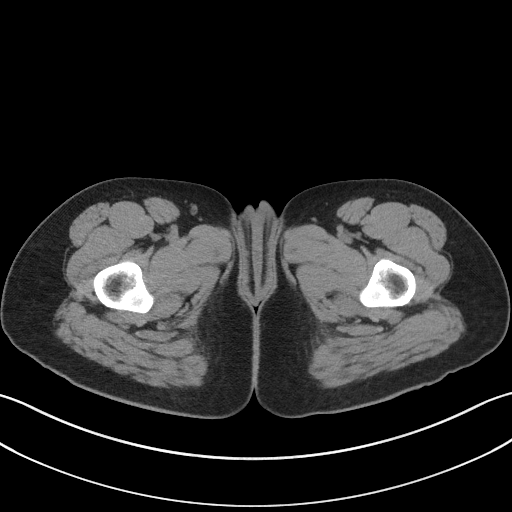
[im 4/87  bone]
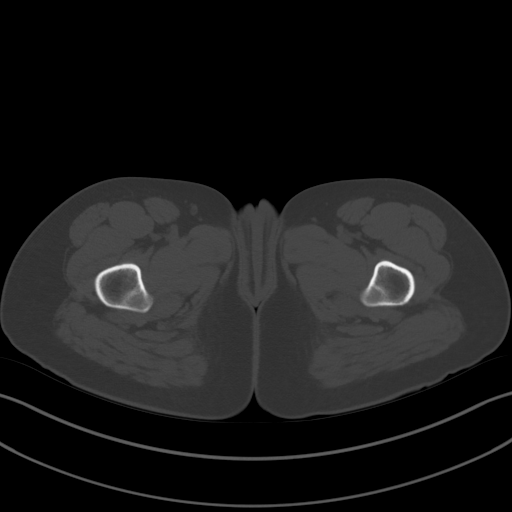
[im 11/87  soft-tissue]
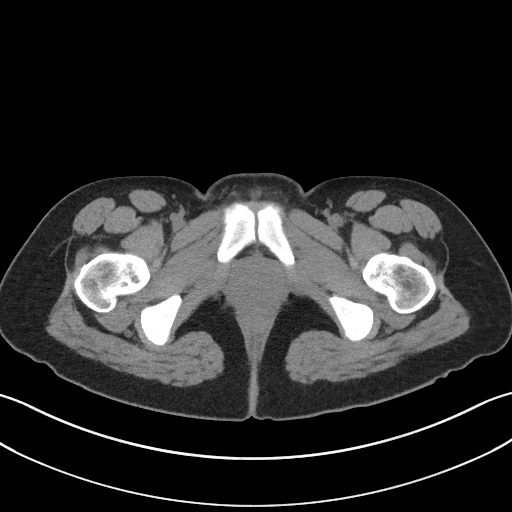
[im 18/87  soft-tissue]
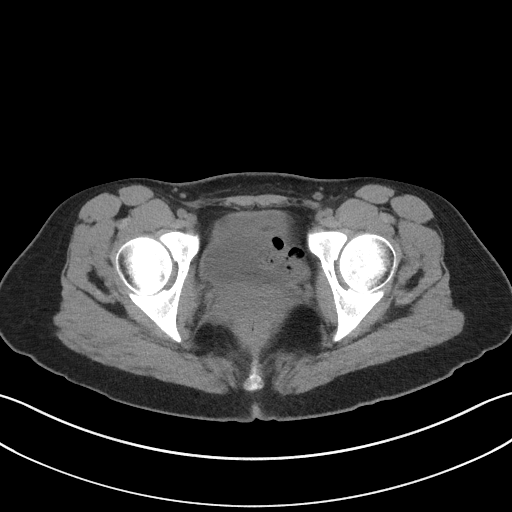
[im 26/87  soft-tissue]
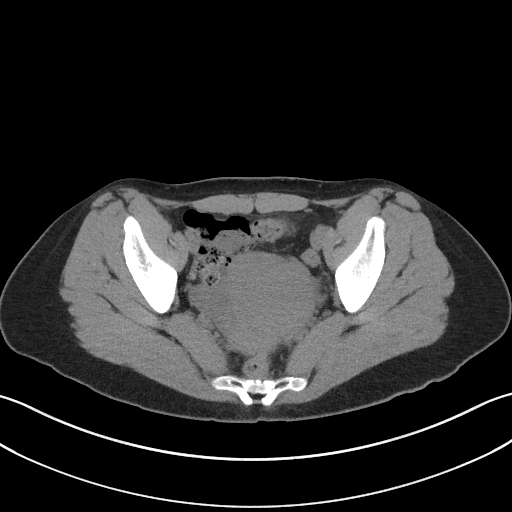
[im 29/87  soft-tissue]
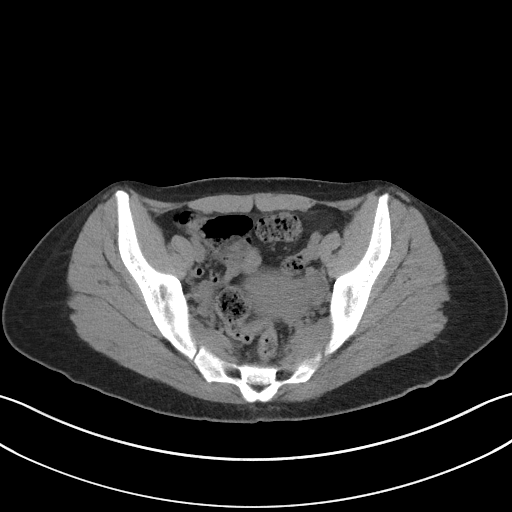
[im 36/87  soft-tissue]
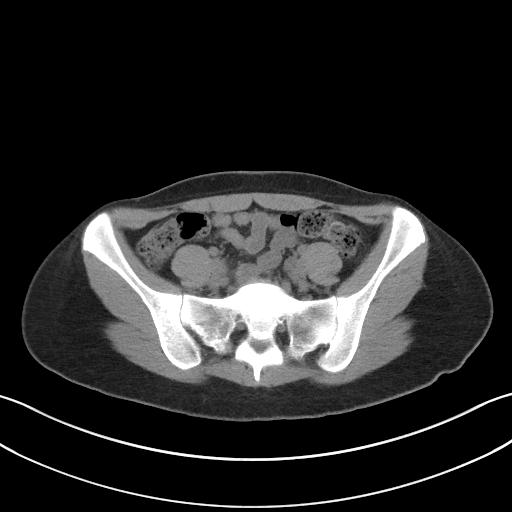
[im 44/87  soft-tissue]
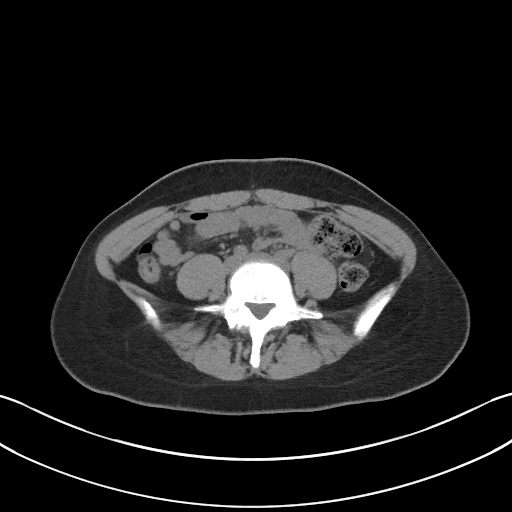
[im 51/87  soft-tissue]
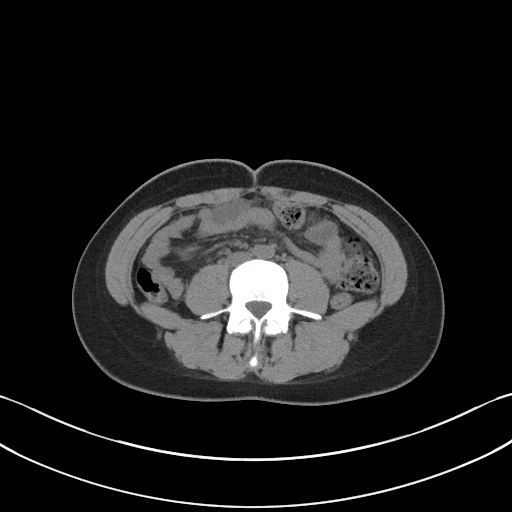
[im 58/87  soft-tissue]
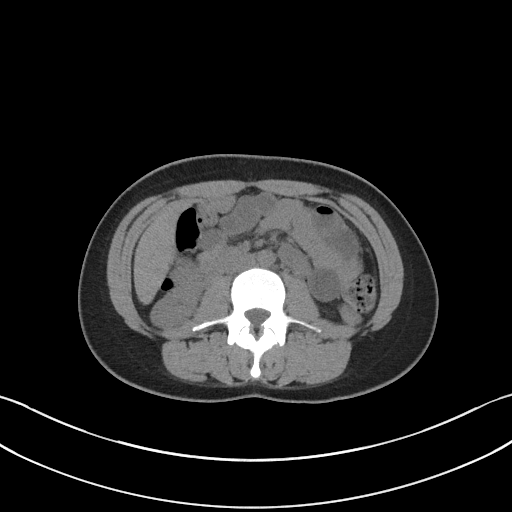
[im 58/87  bone]
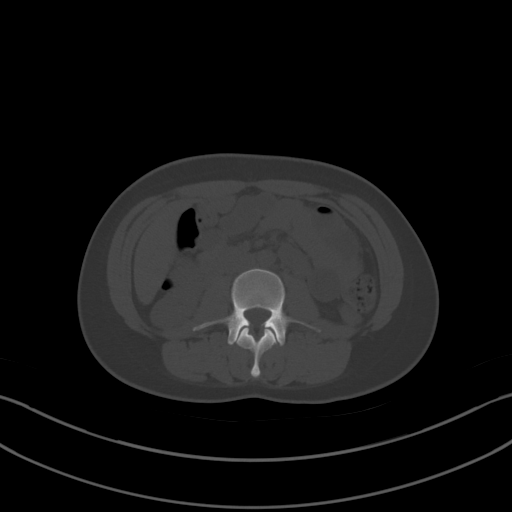
[im 61/87  soft-tissue]
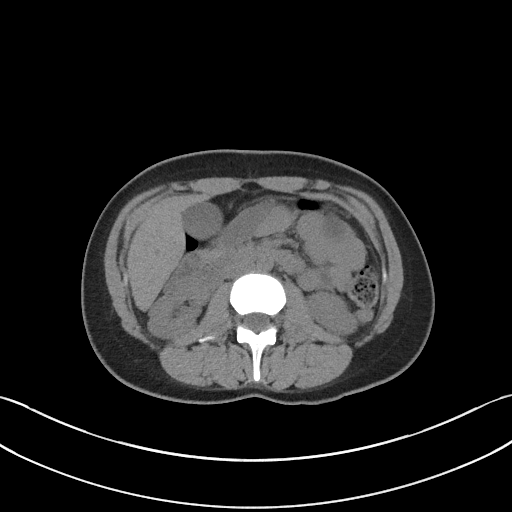
[im 69/87  soft-tissue]
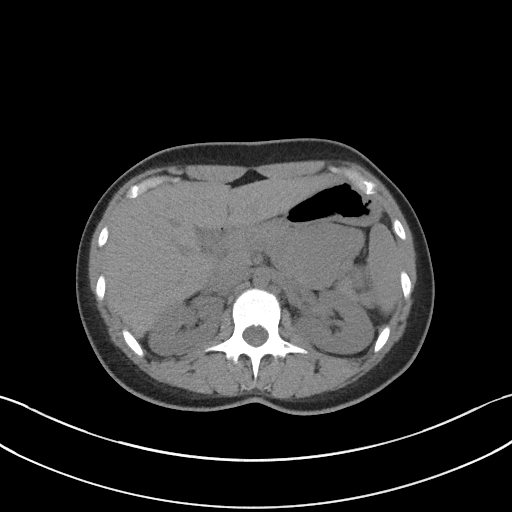
[im 76/87  soft-tissue]
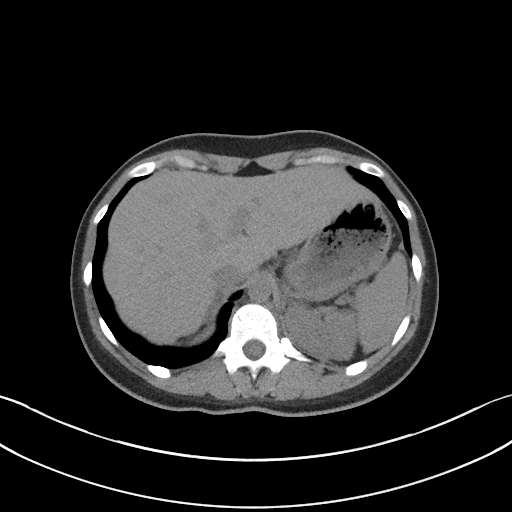
[im 83/87  soft-tissue]
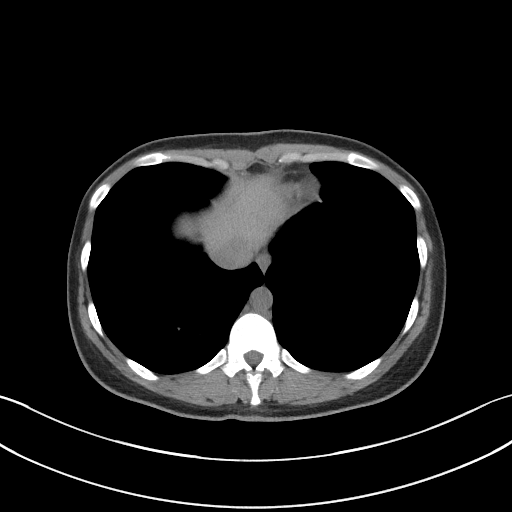

[Series 5: coronal st · coronal · 0.65mm/px · 3 of 72 slices shown]
[im 24/72  soft-tissue]
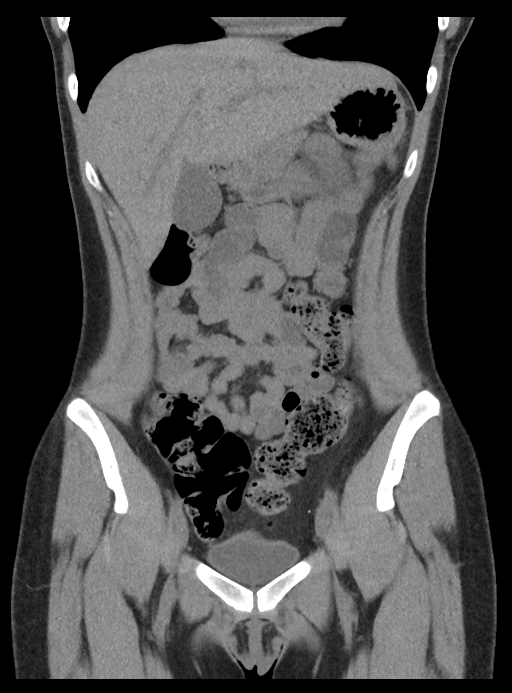
[im 32/72  soft-tissue]
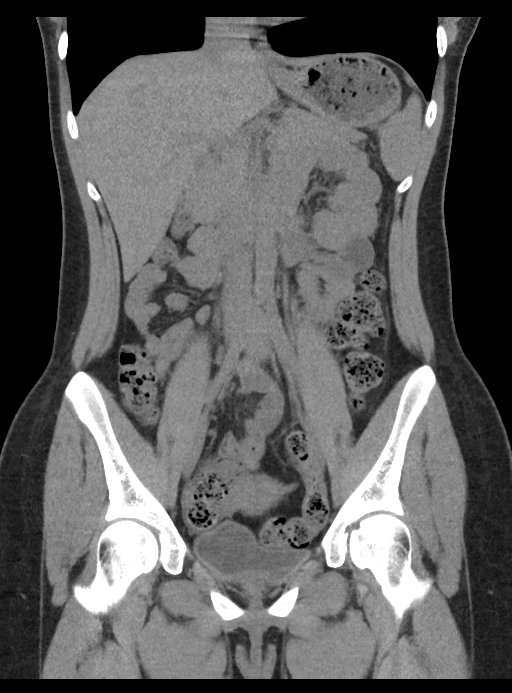
[im 40/72  soft-tissue]
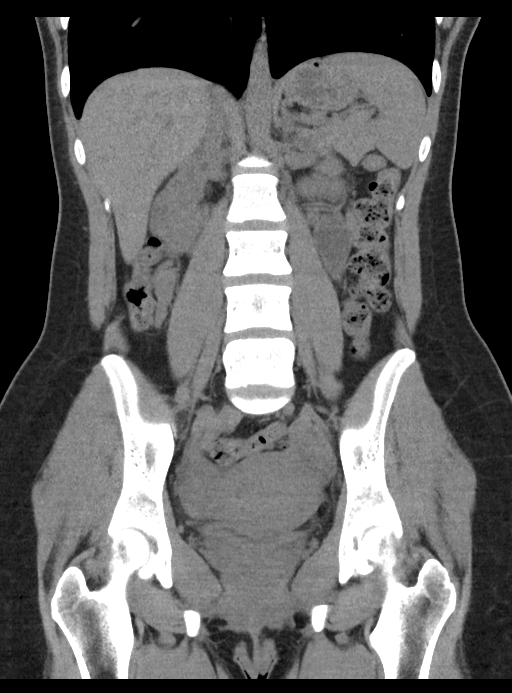

[16 of 46 positions shown; findings below may reference images not displayed]

FINDINGS: Lower chest: No acute abnormality.

Hepatobiliary: No focal liver abnormality is seen. No gallstones,
gallbladder wall thickening, or biliary dilatation.

Pancreas: Unremarkable.

Spleen: Unremarkable.

Adrenals/Urinary Tract: Unremarkable.  Would no calculi identified.

Stomach/Bowel: Unremarkable. Bowel is normal in caliber and appendix
is normal.

Vascular/Lymphatic: No significant vascular findings on this
noncontrast study. There are no enlarged lymph nodes identified.

Reproductive: Endometrial canal appears somewhat displaced likely
reflecting mass seen on prior ultrasound. Adnexa are unremarkable.

Other: No ascites.  Abdominal wall is unremarkable.

Musculoskeletal: No significant or acute osseous abnormality.
IMPRESSION: Displacement of the endometrial canal likely reflecting mass seen on
prior ultrasound. Otherwise unremarkable study.

## 2022-08-27 ENCOUNTER — Other Ambulatory Visit: Payer: Self-pay

## 2022-08-27 DIAGNOSIS — J101 Influenza due to other identified influenza virus with other respiratory manifestations: Secondary | ICD-10-CM | POA: Insufficient documentation

## 2022-08-27 DIAGNOSIS — Z1152 Encounter for screening for COVID-19: Secondary | ICD-10-CM | POA: Insufficient documentation

## 2022-08-27 DIAGNOSIS — J029 Acute pharyngitis, unspecified: Secondary | ICD-10-CM | POA: Diagnosis present

## 2022-08-27 LAB — RESP PANEL BY RT-PCR (RSV, FLU A&B, COVID)  RVPGX2
Influenza A by PCR: NEGATIVE
Influenza B by PCR: POSITIVE — AB
Resp Syncytial Virus by PCR: NEGATIVE
SARS Coronavirus 2 by RT PCR: NEGATIVE

## 2022-08-27 MED ORDER — ACETAMINOPHEN 325 MG PO TABS
650.0000 mg | ORAL_TABLET | Freq: Once | ORAL | Status: AC | PRN
  Administered 2022-08-27: 650 mg via ORAL
  Filled 2022-08-27: qty 2

## 2022-08-27 NOTE — ED Triage Notes (Signed)
Patient arrives to ED POV c/o cough, congestion, headache since yesterday. No other complaints at this time. Pt a/o x4.

## 2022-08-28 ENCOUNTER — Emergency Department (HOSPITAL_BASED_OUTPATIENT_CLINIC_OR_DEPARTMENT_OTHER)
Admission: EM | Admit: 2022-08-28 | Discharge: 2022-08-28 | Disposition: A | Discharge status: Home / Self Care | Payer: Commercial Managed Care - HMO | Attending: Emergency Medicine | Admitting: Emergency Medicine

## 2022-08-28 ENCOUNTER — Encounter (HOSPITAL_BASED_OUTPATIENT_CLINIC_OR_DEPARTMENT_OTHER): Payer: Self-pay | Admitting: Emergency Medicine

## 2022-08-28 DIAGNOSIS — J101 Influenza due to other identified influenza virus with other respiratory manifestations: Secondary | ICD-10-CM | POA: Diagnosis not present

## 2022-08-28 LAB — URINALYSIS, ROUTINE W REFLEX MICROSCOPIC
Bilirubin Urine: NEGATIVE
Glucose, UA: NEGATIVE mg/dL
Hgb urine dipstick: NEGATIVE
Ketones, ur: NEGATIVE mg/dL
Leukocytes,Ua: NEGATIVE
Nitrite: NEGATIVE
Protein, ur: NEGATIVE mg/dL
Specific Gravity, Urine: <=1.005 (ref 1.005–1.030)
pH: 5.5 (ref 5.0–8.0)

## 2022-08-28 LAB — PREGNANCY, URINE: Preg Test, Ur: NEGATIVE

## 2022-08-28 NOTE — ED Provider Notes (Signed)
   [facility] EM[facility]PARTMENT  Provider Note  CSN: [id number] Arrival date & time: 08/27/22 2240  History Chief Complaint  Patient presents with   Cough   Sore Throat    Margaret Franklin is a 31 y.o. female with no PMH reports 3 days of sore throat, cough, fever and body aches. Has been around other sick people.    Home Medications Prior to Admission medications   Medication Sig Start Date End Date Taking? Authorizing Provider  HYDROcodone-acetaminophen (NORCO/VICODIN) 5-325 MG tablet Take 1-2 tablets by mouth every 6 (six) hours as needed for severe pain. 01/01/20   Hayden Rasmussen, MD  ibuprofen (ADVIL) 800 MG tablet Take 1 tablet (800 mg total) by mouth 3 (three) times daily. 12/13/19   Palumbo, April, MD     Allergies    Patient has no known allergies.   Review of Systems   Review of Systems Please see HPI for pertinent positives and negatives  Physical Exam BP 112/67 (BP Location: Right Arm)   Pulse 87   Temp 99.1 F (37.3 C) (Oral)   Resp 17   Ht '5\' 8"'$  (1.727 m)   Wt 68 kg   SpO2 98%   BMI 22.81 kg/m   Physical Exam Vitals and nursing note reviewed.  Constitutional:      Appearance: Normal appearance.  HENT:     Head: Normocephalic and atraumatic.     Nose: Nose normal.     Mouth/Throat:     Mouth: Mucous membranes are moist.     Pharynx: Posterior oropharyngeal erythema present. No oropharyngeal exudate.     Tonsils: No tonsillar exudate.  Eyes:     Extraocular Movements: Extraocular movements intact.     Conjunctiva/sclera: Conjunctivae normal.  Cardiovascular:     Rate and Rhythm: Normal rate.  Pulmonary:     Effort: Pulmonary effort is normal.     Breath sounds: Normal breath sounds.  Abdominal:     General: Abdomen is flat.     Palpations: Abdomen is soft.     Tenderness: There is no abdominal tenderness.  Musculoskeletal:        General: No swelling. Normal range of motion.     Cervical back: Neck supple.  Lymphadenopathy:      Cervical: No cervical adenopathy.  Skin:    General: Skin is warm and dry.  Neurological:     General: No focal deficit present.     Mental Status: She is alert.  Psychiatric:        Mood and Affect: Mood normal.     ED Results / Procedures / Treatments   EKG None  Procedures Procedures  Medications Ordered in the ED Medications  acetaminophen (TYLENOL) tablet 650 mg (650 mg Oral Given 08/27/22 2302)    Initial Impression and Plan  Patient here with viral URI, positive for flu B. No chronic medical conditions to warrant tamiflu. Recommend OTC symptomatic treatment. Oral hydration and isolation until improved. RTED for any other concerns.   ED Course       MDM Rules/Calculators/A&P Medical Decision Making Problems Addressed: Influenza B: acute illness or injury  Amount and/or Complexity of Data Reviewed Labs: ordered. Decision-making details documented in ED Course.  Risk OTC drugs.    Final Clinical Impression(s) / ED Diagnoses Final diagnoses:  Influenza B    Rx / DC Orders ED Discharge Orders     None        Truddie Hidden, MD 08/28/22 [phone]

## 2022-08-31 ENCOUNTER — Ambulatory Visit
Admission: EM | Admit: 2022-08-31 | Discharge: 2022-08-31 | Disposition: A | Discharge status: Home / Self Care | Payer: Medicaid Other | Attending: Urgent Care | Admitting: Urgent Care

## 2022-08-31 DIAGNOSIS — J101 Influenza due to other identified influenza virus with other respiratory manifestations: Secondary | ICD-10-CM

## 2022-08-31 MED ORDER — CETIRIZINE HCL 10 MG PO TABS: 10.0000 mg | ORAL_TABLET | Freq: Every day | ORAL | 0 refills | Status: AC

## 2022-08-31 MED ORDER — IBUPROFEN 600 MG PO TABS: 600.0000 mg | ORAL_TABLET | Freq: Four times a day (QID) | ORAL | 0 refills | Status: AC | PRN

## 2022-08-31 MED ORDER — PSEUDOEPHEDRINE HCL 60 MG PO TABS: 60.0000 mg | ORAL_TABLET | Freq: Three times a day (TID) | ORAL | 0 refills | Status: DC | PRN

## 2022-08-31 MED ORDER — ACETAMINOPHEN 325 MG PO TABS: 650.0000 mg | ORAL_TABLET | Freq: Four times a day (QID) | ORAL | 0 refills | Status: AC | PRN

## 2022-08-31 MED ORDER — ACETAMINOPHEN 325 MG PO TABS
650.0000 mg | ORAL_TABLET | Freq: Once | ORAL | Status: AC
  Administered 2022-08-31: 650 mg via ORAL

## 2022-08-31 MED ORDER — PROMETHAZINE-DM 6.25-15 MG/5ML PO SYRP: 2.5000 mL | ORAL_SOLUTION | Freq: Three times a day (TID) | ORAL | 0 refills | Status: DC | PRN

## 2022-08-31 NOTE — ED Triage Notes (Signed)
Pt states that she has a headache, body aches, chills, cough, nasal congestion and fever. X4 days

## 2022-08-31 NOTE — Discharge Instructions (Addendum)
For sore throat or cough try using a honey-based tea. Use 3 teaspoons of honey with juice squeezed from half lemon. Place shaved pieces of ginger into 1/2-1 cup of water and warm over stove top. Then mix the ingredients and repeat every 4 hours as needed. Please take ibuprofen '600mg'$  every 6 hours with food alternating with OR taken together with Tylenol '500mg'$ -'650mg'$  every 6 hours for throat pain, fevers, aches and pains. Hydrate very well with at least 2 liters of water. Eat light meals such as soups (chicken and noodles, vegetable, chicken and wild rice).  Do not eat foods that you are allergic to.  Taking an antihistamine like Zyrtec can help against postnasal drainage, sinus congestion which can cause sinus pain, sinus headaches, throat pain, painful swallowing, coughing.  You can take this together with pseudoephedrine (Sudafed) at a dose of 60 mg 3 times a day or twice daily as needed for the same kind of nasal drip, congestion.  Use the cough medications as needed.

## 2022-08-31 NOTE — ED Provider Notes (Signed)
[facility]  Note:  This document was prepared using Systems analyst and may include unintentional dictation errors.  MRN: [id number] DOB: 1991-08-02  Subjective:   Margaret Franklin is a 32 y.o. female presenting for 4-day history of persistent fevers, sinus headaches, body aches, sinus congestion, coughing and chills.  She was seen August 27, 2022 and tested positive for influenza B.  No chest pain, shortness of breath or wheezing.  No history of asthma or respiratory disorders.  Patient is otherwise healthy.   Current Facility-Administered Medications:    acetaminophen (TYLENOL) tablet 650 mg, 650 mg, Oral, Once, Jaynee Eagles, PA-C  Current Outpatient Medications:    HYDROcodone-acetaminophen (NORCO/VICODIN) 5-325 MG tablet, Take 1-2 tablets by mouth every 6 (six) hours as needed for severe pain., Disp: 10 tablet, Rfl: 0   ibuprofen (ADVIL) 800 MG tablet, Take 1 tablet (800 mg total) by mouth 3 (three) times daily., Disp: 21 tablet, Rfl: 0   No Known Allergies  Past Medical History:  Diagnosis Date   Ovarian cyst      History reviewed. No pertinent surgical history.  History reviewed. No pertinent family history.  Social History   Tobacco Use   Smoking status: Never   Smokeless tobacco: Never  Vaping Use   Vaping Use: Never used  Substance Use Topics   Alcohol use: Not Currently   Drug use: Not Currently    ROS   Objective:   Vitals: BP 108/71 (BP Location: Left Arm)   Pulse 90   Temp (!) 100.4 F (38 C) (Oral)   Resp 20   Ht '5\' 8"'$  (1.727 m)   Wt 150 lb (68 kg)   LMP 08/09/2022   SpO2 98%   BMI 22.81 kg/m   Physical Exam Constitutional:      General: She is not in acute distress.    Appearance: Normal appearance. She is well-developed and normal weight. She is not ill-appearing, toxic-appearing or diaphoretic.  HENT:     Head: Normocephalic and atraumatic.     Right Ear: Tympanic membrane, ear canal and  external ear normal. No drainage or tenderness. No middle ear effusion. There is no impacted cerumen. Tympanic membrane is not erythematous or bulging.     Left Ear: Tympanic membrane, ear canal and external ear normal. No drainage or tenderness.  No middle ear effusion. There is no impacted cerumen. Tympanic membrane is not erythematous or bulging.     Nose: Nose normal. No congestion or rhinorrhea.     Mouth/Throat:     Mouth: Mucous membranes are moist. No oral lesions.     Pharynx: No pharyngeal swelling, oropharyngeal exudate, posterior oropharyngeal erythema or uvula swelling.     Tonsils: No tonsillar exudate or tonsillar abscesses.  Eyes:     General: No scleral icterus.       Right eye: No discharge.        Left eye: No discharge.     Extraocular Movements: Extraocular movements intact.     Right eye: Normal extraocular motion.     Left eye: Normal extraocular motion.     Conjunctiva/sclera: Conjunctivae normal.     Pupils: Pupils are equal, round, and reactive to light.  Cardiovascular:     Rate and Rhythm: Normal rate and regular rhythm.     Heart sounds: Normal heart sounds. No murmur heard.    No friction rub. No gallop.  Pulmonary:     Effort: Pulmonary effort is normal. No respiratory distress.  Breath sounds: No stridor. No wheezing, rhonchi or rales.  Chest:     Chest wall: No tenderness.  Musculoskeletal:     Cervical back: Normal range of motion and neck supple.  Lymphadenopathy:     Cervical: No cervical adenopathy.  Skin:    General: Skin is warm and dry.  Neurological:     General: No focal deficit present.     Mental Status: She is alert and oriented to person, place, and time.  Psychiatric:        Mood and Affect: Mood normal.        Behavior: Behavior normal.     Assessment and Plan :   PDMP not reviewed this encounter.  1. Influenza B     Deferred imaging given clear cardiopulmonary exam, hemodynamically stable vital signs.  Patient has been  on the window where Tamiflu would be useful to her.  Recommended supportive care for her symptoms in the context of influenza B. Counseled patient on potential for adverse effects with medications prescribed/recommended today, ER and return-to-clinic precautions discussed, patient verbalized understanding.    Jaynee Eagles, [place] 08/31/22 1554

## 2022-10-28 ENCOUNTER — Encounter (HOSPITAL_BASED_OUTPATIENT_CLINIC_OR_DEPARTMENT_OTHER): Payer: Self-pay | Admitting: Emergency Medicine

## 2022-10-28 ENCOUNTER — Emergency Department (HOSPITAL_BASED_OUTPATIENT_CLINIC_OR_DEPARTMENT_OTHER)
Admission: EM | Admit: 2022-10-28 | Discharge: 2022-10-28 | Disposition: A | Discharge status: Home / Self Care | Payer: Commercial Managed Care - HMO | Attending: Emergency Medicine | Admitting: Emergency Medicine

## 2022-10-28 ENCOUNTER — Ambulatory Visit (INDEPENDENT_AMBULATORY_CARE_PROVIDER_SITE_OTHER)
Admission: EM | Admit: 2022-10-28 | Discharge: 2022-10-28 | Disposition: A | Discharge status: Home / Self Care | Payer: Commercial Managed Care - HMO | Source: Home / Self Care

## 2022-10-28 ENCOUNTER — Other Ambulatory Visit: Payer: Self-pay

## 2022-10-28 DIAGNOSIS — B349 Viral infection, unspecified: Secondary | ICD-10-CM | POA: Insufficient documentation

## 2022-10-28 DIAGNOSIS — Z1152 Encounter for screening for COVID-19: Secondary | ICD-10-CM | POA: Insufficient documentation

## 2022-10-28 DIAGNOSIS — J029 Acute pharyngitis, unspecified: Secondary | ICD-10-CM | POA: Insufficient documentation

## 2022-10-28 DIAGNOSIS — R519 Headache, unspecified: Secondary | ICD-10-CM | POA: Insufficient documentation

## 2022-10-28 LAB — RESP PANEL BY RT-PCR (RSV, FLU A&B, COVID)  RVPGX2
Influenza A by PCR: NEGATIVE
Influenza B by PCR: NEGATIVE
Resp Syncytial Virus by PCR: NEGATIVE
SARS Coronavirus 2 by RT PCR: NEGATIVE

## 2022-10-28 LAB — POCT RAPID STREP A (OFFICE): Rapid Strep A Screen: NEGATIVE

## 2022-10-28 MED ORDER — IBUPROFEN 800 MG PO TABS
800.0000 mg | ORAL_TABLET | Freq: Once | ORAL | Status: AC
  Administered 2022-10-28: 800 mg via ORAL
  Filled 2022-10-28: qty 1

## 2022-10-28 MED ORDER — LIDOCAINE VISCOUS HCL 2 % MT SOLN
15.0000 mL | Freq: Once | OROMUCOSAL | Status: AC
  Administered 2022-10-28: 15 mL via OROMUCOSAL
  Filled 2022-10-28: qty 15

## 2022-10-28 MED ORDER — LIDOCAINE VISCOUS HCL 2 % MT SOLN: 15.0000 mL | Freq: Four times a day (QID) | OROMUCOSAL | 0 refills | Status: DC | PRN

## 2022-10-28 NOTE — ED Triage Notes (Signed)
Pt c/o sore throat, headache, fatigue, body aches, and sinus pressure.   Started: yesterday   Home interventions: none

## 2022-10-28 NOTE — ED Provider Notes (Signed)
[facility] Provider Note   CSN: [id number] Arrival date & time: 10/28/22  1336     History  Chief Complaint  Patient presents with   Sore Throat   HPI Margaret Franklin is a 32 y.o. female presenting for sore throat.  Started 2 days ago.  Also had bodyaches and chills but no fever.  Was seen earlier today at urgent care and was found to be COVID-negative and strep negative but states they did not have the ability test for flu.  Patient is requesting that at this time.  No trouble breathing, no trouble swallowing no drooling.  Chest pain.   Sore Throat       Home Medications Prior to Admission medications   Medication Sig Start Date End Date Taking? Authorizing Provider  acetaminophen (TYLENOL) 325 MG tablet Take 2 tablets (650 mg total) by mouth every 6 (six) hours as needed for moderate pain. 08/31/22   Jaynee Eagles, PA-C  cetirizine (ZYRTEC ALLERGY) 10 MG tablet Take 1 tablet (10 mg total) by mouth daily. 08/31/22   Jaynee Eagles, PA-C  ibuprofen (ADVIL) 600 MG tablet Take 1 tablet (600 mg total) by mouth every 6 (six) hours as needed. 08/31/22   Jaynee Eagles, PA-C  lidocaine (XYLOCAINE) 2 % solution Use as directed 15 mLs in the mouth or throat every 6 (six) hours as needed (sore throat). 10/28/22   Melynda Ripple, NP  promethazine-dextromethorphan (PROMETHAZINE-DM) 6.25-15 MG/5ML syrup Take 2.5 mLs by mouth 3 (three) times daily as needed for cough. 08/31/22   Jaynee Eagles, PA-C  pseudoephedrine (SUDAFED) 60 MG tablet Take 1 tablet (60 mg total) by mouth every 8 (eight) hours as needed for congestion. 08/31/22   Jaynee Eagles, PA-C      Allergies    Patient has no known allergies.    Review of Systems   Review of Systems  HENT:  Positive for sore throat.     Physical Exam   Vitals:   10/28/22 1354  BP: 123/78  Pulse: 73  Resp: 20  Temp: 99 F (37.2 C)  SpO2: 100%    CONSTITUTIONAL:  well-appearing, NAD NEURO:  Alert and oriented x 3,  CN 3-12 grossly intact EYES:  eyes equal and reactive ENT/NECK:  Supple, no stridor, posterior erythema, no exudate or swelling, uvula is midline, no peritonsillar abscess CARDIO:  regular rate and  rhythm, appears well-perfused  PULM:  No respiratory distress, CTAB GI/GU:  non-distended, soft MSK/SPINE:  No gross deformities, no edema, moves all extremities  SKIN:  no rash, atraumatic   *Additional and/or pertinent findings included in MDM below    ED Results / Procedures / Treatments   Labs (all labs ordered are listed, but only abnormal results are displayed) Labs Reviewed  RESP PANEL BY RT-PCR (RSV, FLU A&B, COVID)  [id number]    EKG None  Radiology No results found.  Procedures Procedures    Medications Ordered in ED Medications  lidocaine (XYLOCAINE) 2 % viscous mouth solution 15 mL (15 mLs Mouth/Throat Given 10/28/22 1517)  ibuprofen (ADVIL) tablet 800 mg (800 mg Oral Given 10/28/22 1517)    ED Course/ Medical Decision Making/ A&P                             Medical Decision Making Risk Prescription drug management.   32 year old female who is well-appearing and hemodynamically stable presenting for sore throat. Exam notable for erythematous but  otherwise unremarkable.  Chart review, had a negative rapid strep test earlier today.  Had low suspicion for strep given no tonsillar exudate and no fever. Respiratory PCR was also negative for RSV, flu and COVID.  Symptoms and clinical findings are consistent with a ongoing viral illness. Treated her sore throat with viscous lidocaine and ibuprofen. Advised to follow-up with her primary care provider if her symptoms persisted. Otherwise recommend conservative treatment at home.  Discussed return precautions.        Final Clinical Impression(s) / ED Diagnoses Final diagnoses:  Sore throat    Rx / DC Orders ED Discharge Orders     None         Harriet Pho, PA-C 10/28/22 1613    Drenda Freeze,  MD 10/28/22 1700

## 2022-10-28 NOTE — ED Provider Notes (Signed)
[facility]    CSN: [id number] Arrival date & time: 10/28/22  1218      History   Chief Complaint Chief Complaint  Patient presents with   Sore Throat   Headache   Generalized Body Aches    HPI Margaret Franklin is a 32 y.o. female  presents for evaluation of URI symptoms for 3 days. Patient reports associated symptoms of sore throat, headache, body aches, runny nose. Denies N/V/D, cough, fevers, ear pain, shortness of breath. Patient does not have a hx of asthma or smoking. No known sick contacts.  Pt has taken saline nasal rinses OTC for symptoms. Pt has no other concerns at this time.     Sore Throat Associated symptoms include headaches.  Headache Associated symptoms: myalgias and sore throat     Past Medical History:  Diagnosis Date   Ovarian cyst     There are no problems to display for this patient.   History reviewed. No pertinent surgical history.  OB History   No obstetric history on file.      Home Medications    Prior to Admission medications   Medication Sig Start Date End Date Taking? Authorizing Provider  lidocaine (XYLOCAINE) 2 % solution Use as directed 15 mLs in the mouth or throat every 6 (six) hours as needed (sore throat). 10/28/22  Yes Melynda Ripple, NP  acetaminophen (TYLENOL) 325 MG tablet Take 2 tablets (650 mg total) by mouth every 6 (six) hours as needed for moderate pain. 08/31/22   Jaynee Eagles, PA-C  cetirizine (ZYRTEC ALLERGY) 10 MG tablet Take 1 tablet (10 mg total) by mouth daily. 08/31/22   Jaynee Eagles, PA-C  ibuprofen (ADVIL) 600 MG tablet Take 1 tablet (600 mg total) by mouth every 6 (six) hours as needed. 08/31/22   Jaynee Eagles, PA-C  promethazine-dextromethorphan (PROMETHAZINE-DM) 6.25-15 MG/5ML syrup Take 2.5 mLs by mouth 3 (three) times daily as needed for cough. 08/31/22   Jaynee Eagles, PA-C  pseudoephedrine (SUDAFED) 60 MG tablet Take 1 tablet (60 mg total) by mouth every 8 (eight) hours as needed for congestion. 08/31/22    Jaynee Eagles, PA-C    Family History History reviewed. No pertinent family history.  Social History Social History   Tobacco Use   Smoking status: Never   Smokeless tobacco: Never  Vaping Use   Vaping Use: Never used  Substance Use Topics   Alcohol use: Not Currently   Drug use: Not Currently     Allergies   Patient has no known allergies.   Review of Systems Review of Systems  HENT:  Positive for rhinorrhea and sore throat.   Musculoskeletal:  Positive for myalgias.  Neurological:  Positive for headaches.     Physical Exam Triage Vital Signs ED Triage Vitals  Enc Vitals Group     BP 10/28/22 1228 115/78     Pulse Rate 10/28/22 1228 74     Resp 10/28/22 1228 18     Temp 10/28/22 1228 98.9 F (37.2 C)     Temp Source 10/28/22 1228 Oral     SpO2 10/28/22 1228 97 %     Weight --      Height --      Head Circumference --      Peak Flow --      Pain Score 10/28/22 1227 8     Pain Loc --      Pain Edu? --      Excl. in [place]? --  No data found.  Updated Vital Signs BP 115/78 (BP Location: Right Arm)   Pulse 74   Temp 98.9 F (37.2 C) (Oral)   Resp 18   LMP 10/11/2022   SpO2 97%   Visual Acuity Right Eye Distance:   Left Eye Distance:   Bilateral Distance:    Right Eye Near:   Left Eye Near:    Bilateral Near:     Physical Exam Vitals and nursing note reviewed.  Constitutional:      General: She is not in acute distress.    Appearance: She is well-developed. She is not ill-appearing.  HENT:     Head: Normocephalic and atraumatic.     Right Ear: Tympanic membrane and ear canal normal.     Left Ear: Tympanic membrane and ear canal normal.     Nose: Congestion present.     Mouth/Throat:     Mouth: Mucous membranes are moist.     Pharynx: Oropharynx is clear. Uvula midline. Posterior oropharyngeal erythema present.     Tonsils: No tonsillar exudate or tonsillar abscesses.  Eyes:     Conjunctiva/sclera: Conjunctivae normal.     Pupils: Pupils  are equal, round, and reactive to light.  Cardiovascular:     Rate and Rhythm: Normal rate and regular rhythm.     Heart sounds: Normal heart sounds.  Pulmonary:     Effort: Pulmonary effort is normal.     Breath sounds: Normal breath sounds.  Musculoskeletal:     Cervical back: Normal range of motion and neck supple.  Lymphadenopathy:     Cervical: No cervical adenopathy.  Skin:    General: Skin is warm and dry.  Neurological:     General: No focal deficit present.     Mental Status: She is alert and oriented to person, place, and time.  Psychiatric:        Mood and Affect: Mood normal.        Behavior: Behavior normal.      UC Treatments / Results  Labs (all labs ordered are listed, but only abnormal results are displayed) Labs Reviewed  SARS CORONAVIRUS 2 (TAT 6-24 HRS)  CULTURE, GROUP A STREP [facility])  POCT RAPID STREP A (OFFICE)    EKG   Radiology No results found.  Procedures Procedures (including critical care time)  Medications Ordered in UC Medications - No data to display  Initial Impression / Assessment and Plan / UC Course  I have reviewed the triage vital signs and the nursing notes.  Pertinent labs & imaging results that were available during my care of the patient were reviewed by me and considered in my medical decision making (see chart for details).     Negative rapid strep, will culture COVID PCR and will contact if positive Lidocaine gargle as needed sore throat Discussed viral illness and symptomatic treatment PCP follow-up if symptoms do not improve ER precautions reviewed and patient verbalized understanding Final Clinical Impressions(s) / UC Diagnoses   Final diagnoses:  Sore throat  Viral illness     Discharge Instructions      Your symptoms and exam are consistent for a viral illness. Please treat your symptoms with over the counter cough medication, tylenol or ibuprofen, humidifier, and rest.  Lidocaine gargle as needed.   Gargle and spit do not swallow.  The clinic will contact you with results of your COVID testing or throat culture are positive.  Viral illnesses can last 7-14 days. Please follow up with your PCP if  your symptoms are not improving. Please go to the ER for any worsening symptoms. This includes but is not limited to fever you can not control with tylenol or ibuprofen, you are not able to stay hydrated, you have shortness of breath or chest pain.  Thank you for choosing [facility] for your healthcare needs. I hope you feel better soon!    ED Prescriptions     Medication Sig Dispense Auth. Provider   lidocaine (XYLOCAINE) 2 % solution Use as directed 15 mLs in the mouth or throat every 6 (six) hours as needed (sore throat). 100 mL Melynda Ripple, NP      PDMP not reviewed this encounter.   Melynda Ripple, NP 10/28/22 1257

## 2022-10-28 NOTE — ED Triage Notes (Signed)
Pt c/o sore throat, body aches; was just seen at UC, neg for Covid and strep, but wants a flu test

## 2022-10-28 NOTE — Discharge Instructions (Signed)
Your symptoms and exam are consistent for a viral illness. Please treat your symptoms with over the counter cough medication, tylenol or ibuprofen, humidifier, and rest.  Lidocaine gargle as needed.  Gargle and spit do not swallow.  The clinic will contact you with results of your COVID testing or throat culture are positive.  Viral illnesses can last 7-14 days. Please follow up with your PCP if your symptoms are not improving. Please go to the ER for any worsening symptoms. This includes but is not limited to fever you can not control with tylenol or ibuprofen, you are not able to stay hydrated, you have shortness of breath or chest pain.  Thank you for choosing [facility] for your healthcare needs. I hope you feel better soon!

## 2022-10-28 NOTE — Discharge Instructions (Signed)
Evaluation today was overall reassuring.  Respiratory PCR was negative for RSV, COVID and flu.  However it is likely that you may have an ongoing viral illness.  Recommend conservative treatment at home which includes rest, and most importantly good hydration.  Can also take Tylenol and ibuprofen for his symptoms.  Please follow-up with your PCP if your symptoms persist.  If you have swelling in your throat, trouble swallowing, new drooling or trouble breathing or any other concerning symptom please return to the emergency department for further evaluation.

## 2022-10-28 NOTE — ED Notes (Signed)
Discharge paperwork reviewed entirely with patient, including Rx's and follow up care. Pain was under control. Pt verbalized understanding as well as all parties involved. No questions or concerns voiced at the time of discharge. No acute distress noted.   Pt ambulated out to PVA without incident or assistance.

## 2022-10-29 LAB — SARS CORONAVIRUS 2 (TAT 6-24 HRS): SARS Coronavirus 2: NEGATIVE

## 2022-10-31 LAB — CULTURE, GROUP A STREP (THRC)

## 2023-05-20 LAB — HM PAP SMEAR: HM Pap smear: NORMAL

## 2023-08-18 ENCOUNTER — Ambulatory Visit (INDEPENDENT_AMBULATORY_CARE_PROVIDER_SITE_OTHER): Payer: Commercial Managed Care - HMO | Admitting: Family

## 2023-08-18 ENCOUNTER — Other Ambulatory Visit (HOSPITAL_COMMUNITY)
Admission: RE | Admit: 2023-08-18 | Discharge: 2023-08-18 | Disposition: A | Discharge status: Home / Self Care | Payer: Commercial Managed Care - HMO | Source: Ambulatory Visit | Attending: Family | Admitting: Family

## 2023-08-18 ENCOUNTER — Encounter: Payer: Self-pay | Admitting: Family

## 2023-08-18 VITALS — BP 111/63 | HR 77 | Temp 99.1°F | Resp 16 | Ht 66.0 in | Wt 148.2 lb

## 2023-08-18 DIAGNOSIS — E559 Vitamin D deficiency, unspecified: Secondary | ICD-10-CM | POA: Diagnosis not present

## 2023-08-18 DIAGNOSIS — Z833 Family history of diabetes mellitus: Secondary | ICD-10-CM | POA: Diagnosis not present

## 2023-08-18 DIAGNOSIS — N760 Acute vaginitis: Secondary | ICD-10-CM

## 2023-08-18 DIAGNOSIS — Z114 Encounter for screening for human immunodeficiency virus [HIV]: Secondary | ICD-10-CM

## 2023-08-18 DIAGNOSIS — R202 Paresthesia of skin: Secondary | ICD-10-CM | POA: Insufficient documentation

## 2023-08-18 DIAGNOSIS — J309 Allergic rhinitis, unspecified: Secondary | ICD-10-CM | POA: Insufficient documentation

## 2023-08-18 DIAGNOSIS — R300 Dysuria: Secondary | ICD-10-CM

## 2023-08-18 DIAGNOSIS — Z1159 Encounter for screening for other viral diseases: Secondary | ICD-10-CM

## 2023-08-18 DIAGNOSIS — Z23 Encounter for immunization: Secondary | ICD-10-CM | POA: Diagnosis not present

## 2023-08-18 DIAGNOSIS — G4700 Insomnia, unspecified: Secondary | ICD-10-CM | POA: Insufficient documentation

## 2023-08-18 LAB — COMPREHENSIVE METABOLIC PANEL
ALT: 11 U/L (ref 0–35)
AST: 13 U/L (ref 0–37)
Albumin: 4.3 g/dL (ref 3.5–5.2)
Alkaline Phosphatase: 59 U/L (ref 39–117)
BUN: 9 mg/dL (ref 6–23)
CO2: 28 meq/L (ref 19–32)
Calcium: 9.3 mg/dL (ref 8.4–10.5)
Chloride: 106 meq/L (ref 96–112)
Creatinine, Ser: 0.73 mg/dL (ref 0.40–1.20)
GFR: 110.05 mL/min (ref >60.00)
Glucose, Bld: 77 mg/dL (ref 70–99)
Potassium: 4.5 meq/L (ref 3.5–5.1)
Sodium: 139 meq/L (ref 135–145)
Total Bilirubin: 0.6 mg/dL (ref 0.2–1.2)
Total Protein: 7.9 g/dL (ref 6.0–8.3)

## 2023-08-18 LAB — B12 AND FOLATE PANEL
Folate: 14.1 ng/mL (ref >5.9)
Vitamin B-12: 403 pg/mL (ref 211–911)

## 2023-08-18 MED ORDER — FLUTICASONE PROPIONATE 50 MCG/ACT NA SUSP: 2.0000 | Freq: Every day | NASAL | Status: AC

## 2023-08-18 NOTE — Assessment & Plan Note (Signed)
  Reports difficulty falling asleep and maintaining sleep. Works irregular hours with late shifts. -Advise to limit caffeine intake, especially after 2pm. -Recommend reducing screen time 90 minutes before bed. -Consider trial of melatonin if sleep does not improve with lifestyle modifications.

## 2023-08-18 NOTE — Assessment & Plan Note (Signed)
Just ordered some OTC vit D gummies but has not started. Update vit D level today.

## 2023-08-18 NOTE — Patient Instructions (Signed)
VISIT SUMMARY:  Today, we addressed several of your health concerns, including urinary symptoms, sleep difficulties, vitamin D deficiency, mucus production, and leg numbness. We also discussed general health maintenance and preventive care.  YOUR PLAN:  -URINARY SYMPTOMS: You reported stinging during urination, which may indicate a urinary tract infection or bacterial vaginosis. We will collect a urine sample for culture and perform a vaginal swab to check for infections.  -INSOMNIA: You have been experiencing difficulty falling and staying asleep. To help improve your sleep, try to limit caffeine intake after 2pm and reduce screen time 90 minutes before bed. If these changes do not help, we may consider a trial of melatonin.  -VITAMIN D DEFICIENCY: You have a history of vitamin D deficiency and are currently taking a supplement. We will check your vitamin D levels to ensure they are within a healthy range.  -ALLERGIC RHINITIS: You have been experiencing daily mucus production and postnasal drip, which may be due to allergies. We recommend trying Flonase in addition to your current antihistamine to help manage these symptoms.  -LEG NUMBNESS: You reported frequent episodes of your legs 'falling asleep.' We will check your B12 and folic acid levels to see if a deficiency might be causing this sensation.  -GENERAL HEALTH MAINTENANCE: We administered your influenza vaccine today. We will schedule a physical exam and update your immunizations in one month. Please consider getting your covid booster at the pharmacy. Additionally, we will check your kidney function and glucose levels due to your family history of diabetes and hypertension.  INSTRUCTIONS:  Please follow up with the lab tests and swabs as discussed. We will contact you with the results and any further recommendations. Schedule your physical exam and immunization update in one month.

## 2023-08-18 NOTE — Assessment & Plan Note (Signed)
Denies back pain.  Will check b12 and folate levels.

## 2023-08-18 NOTE — Progress Notes (Signed)
Subjective:     Patient ID: Margaret Franklin, female    DOB: 1990/11/16, 32 y.o.   MRN: [id number]  Chief Complaint  Patient presents with   New Patient (Initial Visit)    Here to establish care, used to go to [facility]    HPI  Discussed the use of AI scribe software for clinical note transcription with the patient, who gave verbal consent to proceed.  History of Present Illness   Margaret Franklin, a patient with a past medical history of fibroid removal surgery in 2021 and vitamin D deficiency, presents to establish care. She was previously followed by [facility]. The patient reports several concerns. The primary concern is related to gut health, as the patient has been experiencing irregular bowel movements. The patient also reports difficulty sleeping, often staying awake late into the night despite attempts to regulate sleep schedule. The patient also mentions heavy mucus formation throughout the day in the back of her throat, but it is unclear if this is related to any specific triggers or allergies. She does report that she has tried antihistamines in the past and they will help for a time and then stop working. Another concern is frequent numbness in the legs, described as a sensation similar to legs falling asleep, which occurs both while standing and sitting. Lastly, the patient reports symptoms suggestive of a urinary tract infection, including stinging during urination and a previous sensation of pressure. The patient also mentions a recent sensation of a potential yeast infection, for which fluconazole was taken. Symptoms seemed to worsen after completion.          Health Maintenance Due  Topic Date Due   HIV Screening  Never done   Hepatitis C Screening  Never done   DTaP/Tdap/Td (2 - Td or Tdap) 04/20/2016   COVID-19 Vaccine (1 - 2024-25 season) Never done    Past Medical History:  Diagnosis Date   Fibroid     Past Surgical History:  Procedure Laterality  Date   MYOMECTOMY  2021   wisdom tooth removal  2023    Family History  Problem Relation Age of Onset   Diabetes Mellitus II Maternal Grandmother    Hypertension Maternal Grandmother    Hypertension Maternal Grandfather     Social History   Socioeconomic History   Marital status: Single    Spouse name: Not on file   Number of children: Not on file   Years of education: Not on file   Highest education level: Not on file  Occupational History   Not on file  Tobacco Use   Smoking status: Never   Smokeless tobacco: Never  Vaping Use   Vaping status: Never Used  Substance and Sexual Activity   Alcohol use: Not Currently   Drug use: Not Currently   Sexual activity: Not on file  Other Topics Concern   Not on file  Social History Narrative   Works as a Financial controller   No children   Single lives alone   No pets   Completed bachelors degree (Personal assistant)   Enjoys television, reading   Social Drivers of Corporate investment banker Strain: Not on file  Food Insecurity: Not on file  Transportation Needs: Not on file  Physical Activity: Not on file  Stress: Not on file  Social Connections: Unknown (01/13/2022)   Received from [employer], [facility]   Social Network    Social Network: Not on file  Intimate  Partner Violence: Unknown (12/04/2021)   Received from [facility], [facility]   HITS    Physically Hurt: Not on file    Insult or Talk Down To: Not on file    Threaten Physical Harm: Not on file    Scream or Curse: Not on file    Outpatient Medications Prior to Visit  Medication Sig Dispense Refill   acetaminophen (TYLENOL) 325 MG tablet Take 2 tablets (650 mg total) by mouth every 6 (six) hours as needed for moderate pain. 30 tablet 0   cetirizine (ZYRTEC ALLERGY) 10 MG tablet Take 1 tablet (10 mg total) by mouth daily. 30 tablet 0   ibuprofen (ADVIL) 600 MG tablet Take 1 tablet (600 mg total) by mouth every 6 (six) hours as  needed. 30 tablet 0   promethazine-dextromethorphan (PROMETHAZINE-DM) 6.25-15 MG/5ML syrup Take 2.5 mLs by mouth 3 (three) times daily as needed for cough. 100 mL 0   pseudoephedrine (SUDAFED) 60 MG tablet Take 1 tablet (60 mg total) by mouth every 8 (eight) hours as needed for congestion. 30 tablet 0   lidocaine (XYLOCAINE) 2 % solution Use as directed 15 mLs in the mouth or throat every 6 (six) hours as needed (sore throat). 100 mL 0   No facility-administered medications prior to visit.    No Known Allergies  Review of Systems  Gastrointestinal:  Positive for constipation.  Genitourinary:        Denies incontinence of bowel or bladder  Musculoskeletal:  Negative for back pain.       Denies leg weakness       Objective:    Physical Exam Constitutional:      General: She is not in acute distress.    Appearance: Normal appearance. She is well-developed.  HENT:     Head: Normocephalic and atraumatic.     Right Ear: Tympanic membrane, ear canal and external ear normal.     Left Ear: Tympanic membrane, ear canal and external ear normal.  Eyes:     General: No scleral icterus. Neck:     Thyroid: No thyromegaly.  Cardiovascular:     Rate and Rhythm: Normal rate and regular rhythm.     Heart sounds: Normal heart sounds. No murmur heard. Pulmonary:     Effort: Pulmonary effort is normal. No respiratory distress.     Breath sounds: Normal breath sounds. No wheezing.  Musculoskeletal:     Cervical back: Neck supple.  Skin:    General: Skin is warm and dry.  Neurological:     Mental Status: She is alert and oriented to person, place, and time.     Deep Tendon Reflexes:     Reflex Scores:      Patellar reflexes are 2+ on the right side and 2+ on the left side.    Comments: Bilateral LE strength is 5/5  Psychiatric:        Mood and Affect: Mood normal.        Behavior: Behavior normal.        Thought Content: Thought content normal.        Judgment: Judgment normal.       BP 111/63 (BP Location: Right Arm, Patient Position: Sitting, Cuff Size: Small)   Pulse 77   Temp 99.1 F (37.3 C) (Oral)   Resp 16   Ht 5\' 6"  (1.676 m)   Wt 148 lb 3.2 oz (67.2 kg)   SpO2 99%   BMI 23.92 kg/m  Wt Readings from Last 3  Encounters:  08/18/23 148 lb 3.2 oz (67.2 kg)  10/28/22 143 lb (64.9 kg)  08/31/22 150 lb (68 kg)       Assessment & Plan:   Problem List Items Addressed This Visit       Unprioritized   Vitamin D deficiency - Primary   Just ordered some OTC vit D gummies but has not started. Update vit D level today.      Paresthesia   Denies back pain.  Will check b12 and folate levels.       Relevant Orders   B12 and Folate Panel   Insomnia    Reports difficulty falling asleep and maintaining sleep. Works irregular hours with late shifts. -Advise to limit caffeine intake, especially after 2pm. -Recommend reducing screen time 90 minutes before bed. -Consider trial of melatonin if sleep does not improve with lifestyle modifications.       Allergic rhinitis    Reports daily mucus production and postnasal drip. Partial relief with antihistamines. -Recommend trial of Flonase in addition to antihistamine such as zyrtec.      Relevant Medications   fluticasone (FLONASE) 50 MCG/ACT nasal spray   Other Visit Diagnoses       Acute vaginitis       Relevant Orders   Cervicovaginal ancillary only( [facility])     Dysuria       Relevant Orders   Urine Culture     Encounter for hepatitis C screening test for low risk patient       Relevant Orders   Hepatitis C Antibody     Encounter for screening for HIV       Relevant Orders   HIV antibody (with reflex)     Family history of diabetes mellitus       Relevant Orders   Comp Met (CMET)     Flu shot today.  46 minutes spent on today's visit. Time was spent interviewing patient, reviewing outside records and formulating medical plan.   I have discontinued Diandra Hagedorn's lidocaine. I  am also having her start on fluticasone. Additionally, I am having her maintain her cetirizine, pseudoephedrine, promethazine-dextromethorphan, ibuprofen, and acetaminophen.  Meds ordered this encounter  Medications   fluticasone (FLONASE) 50 MCG/ACT nasal spray    Sig: Place 2 sprays into both nostrils daily.    Supervising Provider:   Danise Edge A [phone]

## 2023-08-18 NOTE — Assessment & Plan Note (Signed)
  Reports daily mucus production and postnasal drip. Partial relief with antihistamines. -Recommend trial of Flonase in addition to antihistamine such as zyrtec.

## 2023-08-19 ENCOUNTER — Telehealth: Payer: Self-pay | Admitting: Family

## 2023-08-19 LAB — CERVICOVAGINAL ANCILLARY ONLY
Bacterial Vaginitis (gardnerella): POSITIVE — AB
Candida Glabrata: NEGATIVE
Candida Vaginitis: NEGATIVE
Chlamydia: NEGATIVE
Comment: NEGATIVE
Comment: NEGATIVE
Comment: NEGATIVE
Comment: NEGATIVE
Comment: NEGATIVE
Comment: NORMAL
Neisseria Gonorrhea: NEGATIVE
Trichomonas: POSITIVE — AB

## 2023-08-19 LAB — HEPATITIS C ANTIBODY: Hepatitis C Ab: NONREACTIVE

## 2023-08-19 LAB — HIV ANTIBODY (ROUTINE TESTING W REFLEX): HIV 1&2 Ab, 4th Generation: NONREACTIVE

## 2023-08-19 MED ORDER — METRONIDAZOLE 500 MG PO TABS: 500.0000 mg | ORAL_TABLET | Freq: Two times a day (BID) | ORAL | 0 refills | Status: AC

## 2023-08-19 NOTE — Telephone Encounter (Signed)
Swab shows trichomonas and bacterial vaginosis.  Start metronidazole 500mg  twice daily for 1 week.  Do not drink alcohol while taking.  She should notify partner(s) and they should also seek treatment.  No sexual contact until she has completed medication.

## 2023-08-20 NOTE — Telephone Encounter (Signed)
Called patient but no answer, left voice mail for patient to call back.   

## 2023-08-20 NOTE — Telephone Encounter (Signed)
Patient called [place] back and was notified of results, new medication and provider's advise.

## 2023-08-26 LAB — URINE CULTURE

## 2023-08-26 LAB — EXTRA URINE SPECIMEN

## 2023-08-27 ENCOUNTER — Telehealth: Payer: Self-pay | Admitting: *Deleted

## 2023-08-27 NOTE — Telephone Encounter (Signed)
Left message on machine for patient to call back.

## 2023-08-27 NOTE — Telephone Encounter (Signed)
We received a fax from [facility] on 08/27/23 about urine culture from 08/18/23.  It stated that urine culture could not be done due to specimen received at incorrect temperature.    We send this out at room temperature so the mistake must have happened with [facility].    Would you like for [place] to get pt back in to recollect?

## 2023-09-10 ENCOUNTER — Encounter: Payer: Self-pay | Admitting: Family

## 2023-09-10 DIAGNOSIS — F419 Anxiety disorder, unspecified: Secondary | ICD-10-CM | POA: Insufficient documentation

## 2023-09-10 DIAGNOSIS — E039 Hypothyroidism, unspecified: Secondary | ICD-10-CM | POA: Insufficient documentation

## 2023-09-20 ENCOUNTER — Encounter: Payer: Commercial Managed Care - HMO | Admitting: Family

## 2023-09-29 ENCOUNTER — Encounter: Payer: Self-pay | Admitting: Family

## 2023-09-29 ENCOUNTER — Ambulatory Visit: Payer: Commercial Managed Care - HMO | Admitting: Family

## 2023-09-29 VITALS — BP 111/63 | HR 63 | Temp 99.1°F | Resp 16 | Ht 67.3 in | Wt 140.0 lb

## 2023-09-29 DIAGNOSIS — Z0000 Encounter for general adult medical examination without abnormal findings: Secondary | ICD-10-CM | POA: Diagnosis not present

## 2023-09-29 DIAGNOSIS — Z23 Encounter for immunization: Secondary | ICD-10-CM | POA: Diagnosis not present

## 2023-09-29 DIAGNOSIS — E559 Vitamin D deficiency, unspecified: Secondary | ICD-10-CM

## 2023-09-29 DIAGNOSIS — G4700 Insomnia, unspecified: Secondary | ICD-10-CM | POA: Diagnosis not present

## 2023-09-29 DIAGNOSIS — J309 Allergic rhinitis, unspecified: Secondary | ICD-10-CM

## 2023-09-29 DIAGNOSIS — R202 Paresthesia of skin: Secondary | ICD-10-CM

## 2023-09-29 DIAGNOSIS — K5900 Constipation, unspecified: Secondary | ICD-10-CM

## 2023-09-29 DIAGNOSIS — E039 Hypothyroidism, unspecified: Secondary | ICD-10-CM | POA: Diagnosis not present

## 2023-09-29 LAB — LIPID PANEL
Cholesterol: 167 mg/dL (ref 0–200)
HDL: 67.6 mg/dL (ref >39.00)
LDL Cholesterol: 91 mg/dL (ref 0–99)
NonHDL: 99.61
Total CHOL/HDL Ratio: 2
Triglycerides: 41 mg/dL (ref 0.0–149.0)
VLDL: 8.2 mg/dL (ref 0.0–40.0)

## 2023-09-29 LAB — TSH: TSH: 2.14 u[IU]/mL (ref 0.35–5.50)

## 2023-09-29 LAB — VITAMIN D 25 HYDROXY (VIT D DEFICIENCY, FRACTURES): VITD: 29.3 ng/mL — ABNORMAL LOW (ref 30.00–100.00)

## 2023-09-29 NOTE — Assessment & Plan Note (Signed)
Improved, monitor.

## 2023-09-29 NOTE — Progress Notes (Signed)
Subjective:     Patient ID: Margaret Franklin, female    DOB: March 22, 1991, 33 y.o.   MRN: [id number]  Chief Complaint  Patient presents with   Annual Exam    HPI  Discussed the use of AI scribe software for clinical note transcription with the patient, who gave verbal consent to proceed.  History of Present Illness   The patient presents for an annual physical exam.  She experiences ongoing constipation characterized by difficult bowel movements resembling 'pebbles.' Dietary changes for a week provided minimal improvement. She has not yet tried fiber supplements or medications for this issue.  She reports persistent nasal congestion and mucus buildup, particularly noticeable upon waking at night. She has used Flonase but has not yet tried Zyrtec.  She experienced a yeast infection following her menstrual cycle, which ended on September 22, 2023. She had previously been treated for trichomoniasis and bacterial vaginosis. She has been taking oregano oil pills in hopes of regulating the yeast infections.  She mentions experiencing numbness in her legs, which has decreased since the last visit. Her B12 levels were checked previously and were normal. No swelling in her legs, urinary issues, or unusual muscle or joint pain.  She has been experiencing sleep difficulties, which she attributes to her work schedule. She describes her sleep quality as variable, with some nights being better than others.  She is attempting to maintain a healthy diet by reducing meat and sugar intake, particularly to manage her eczema, which has been flaring up with noticeable marks. She has noticed some improvement with dietary changes. Her weight has decreased by eight pounds since the last visit.  She works four days a week, with a schedule that sometimes exceeds 100 hours a month. She [place] but has not established a regular exercise routine. No frequent headaches except when  she does not eat. No concerns about depression or anxiety.      Patient presents today for complete physical.  Immunizations: due for tetanus Diet: she is trying Exercise: some Pap Smear: 9/24 Vision: due Dental: due     Health Maintenance Due  Topic Date Due   DTaP/Tdap/Td (2 - Td or Tdap) 04/20/2016   COVID-19 Vaccine (1 - 2024-25 season) Never done    Past Medical History:  Diagnosis Date   Anxiety    Fibroid    Hyperlipidemia    Hypothyroidism    Vitamin D deficiency     Past Surgical History:  Procedure Laterality Date   MYOMECTOMY  2021   wisdom tooth removal  2023    Family History  Problem Relation Age of Onset   Diabetes Mellitus II Maternal Grandmother    Hypertension Maternal Grandmother    Hypertension Maternal Grandfather     Social History   Socioeconomic History   Marital status: Single    Spouse name: Not on file   Number of children: Not on file   Years of education: Not on file   Highest education level: Not on file  Occupational History   Not on file  Tobacco Use   Smoking status: Never   Smokeless tobacco: Never  Vaping Use   Vaping status: Never Used  Substance and Sexual Activity   Alcohol use: Not Currently   Drug use: Not Currently   Sexual activity: Not on file  Other Topics Concern   Not on file  Social History Narrative   Works as a Financial controller   No children  Single lives alone   No pets   Completed bachelors degree [employer])   Enjoys television, reading   Social Drivers of Corporate investment banker Strain: Not on file  Food Insecurity: Not on file  Transportation Needs: Not on file  Physical Activity: Not on file  Stress: Not on file  Social Connections: Unknown (01/13/2022)   Received from [facility], [facility]   Social Network    Social Network: Not on file  Intimate Partner Violence: Unknown (12/04/2021)   Received from [facility], [facility]   HITS     Physically Hurt: Not on file    Insult or Talk Down To: Not on file    Threaten Physical Harm: Not on file    Scream or Curse: Not on file    Outpatient Medications Prior to Visit  Medication Sig Dispense Refill   acetaminophen (TYLENOL) 325 MG tablet Take 2 tablets (650 mg total) by mouth every 6 (six) hours as needed for moderate pain. 30 tablet 0   cetirizine (ZYRTEC ALLERGY) 10 MG tablet Take 1 tablet (10 mg total) by mouth daily. 30 tablet 0   fluticasone (FLONASE) 50 MCG/ACT nasal spray Place 2 sprays into both nostrils daily.     ibuprofen (ADVIL) 600 MG tablet Take 1 tablet (600 mg total) by mouth every 6 (six) hours as needed. 30 tablet 0   pseudoephedrine (SUDAFED) 60 MG tablet Take 1 tablet (60 mg total) by mouth every 8 (eight) hours as needed for congestion. 30 tablet 0   promethazine-dextromethorphan (PROMETHAZINE-DM) 6.25-15 MG/5ML syrup Take 2.5 mLs by mouth 3 (three) times daily as needed for cough. 100 mL 0   No facility-administered medications prior to visit.    No Known Allergies  Review of Systems  HENT:  Positive for congestion. Negative for hearing loss.   Eyes:  Negative for blurred vision.  Respiratory:  Negative for cough.   Cardiovascular:  Negative for leg swelling.  Gastrointestinal:  Positive for constipation. Negative for diarrhea.  Genitourinary:  Negative for dysuria and frequency.  Musculoskeletal:  Negative for joint pain and myalgias.  Skin:  Positive for rash (eczema).  Neurological:  Negative for headaches.  Psychiatric/Behavioral:         Denies depression/anxiety       Objective:    Physical Exam   BP 111/63 (BP Location: Right Arm, Patient Position: Sitting, Cuff Size: Small)   Pulse 63   Temp 99.1 F (37.3 C) (Oral)   Resp 16   Ht 5' 7.3" (1.709 m)   Wt 140 lb (63.5 kg)   SpO2 100%   BMI 21.73 kg/m  Wt Readings from Last 3 Encounters:  09/29/23 140 lb (63.5 kg)  08/18/23 148 lb 3.2 oz (67.2 kg)  10/28/22 143 lb (64.9 kg)    Physical Exam  Constitutional: She is oriented to person, place, and time. She appears well-developed and well-nourished. No distress.  HENT:  Head: Normocephalic and atraumatic.  Right Ear: Tympanic membrane and ear canal normal.  Left Ear: Tympanic membrane and ear canal normal.  Mouth/Throat: Oropharynx is clear and moist.  Eyes: Pupils are equal, round, and reactive to light. No scleral icterus.  Neck: Normal range of motion. No thyromegaly present.  Cardiovascular: Normal rate and regular rhythm.   No murmur heard. Pulmonary/Chest: Effort normal and breath sounds normal. No respiratory distress. He has no wheezes. She has no rales. She exhibits no tenderness.  Abdominal: Soft. Bowel sounds are normal. She  exhibits no distension and no mass. There is no tenderness. There is no rebound and no guarding.  Musculoskeletal: She exhibits no edema.  Lymphadenopathy:    She has no cervical adenopathy.  Neurological: She is alert and oriented to person, place, and time. She has normal patellar reflexes. She exhibits normal muscle tone. Coordination normal.  Skin: Skin is warm and dry.  Psychiatric: She has a normal mood and affect. Her behavior is normal. Judgment and thought content normal.  Breast/pelvic: deferred          Assessment & Plan:       Assessment & Plan:   Problem List Items Addressed This Visit       Unprioritized   Vitamin D deficiency   Relevant Orders   Vitamin D (25 hydroxy)   Preventative health care - Primary    General Health Maintenance -Encourage 30 minutes of sustained cardio 5 days a week. -Schedule eye and dental exams. -Return for annual physical in 1 year, or sooner if any issues arise.  Tetanus vaccine last administered in 2007. -Administer Tetanus vaccine today.       Relevant Orders   Lipid panel   Paresthesia   Improved. B12 and folate are normal.       Insomnia   Improved, monitor.       Hypothyroidism   Not on medication.  Clinically stable. Update TSH.       Relevant Orders   TSH   Constipation    Reports hard, pebble-like stools despite dietary changes. -Start fiber gummies and increase fluid intake. -If no improvement, add MiraLAX once daily, starting with a tablespoon and adjust as needed.       Allergic rhinitis   Unchanged, add back zyrtec to flonase.       I have discontinued Annabelle Politano's promethazine-dextromethorphan. I am also having her maintain her cetirizine, pseudoephedrine, ibuprofen, acetaminophen, and fluticasone.  No orders of the defined types were placed in this encounter.

## 2023-09-29 NOTE — Assessment & Plan Note (Signed)
Not on medication. Clinically stable. Update TSH.

## 2023-09-29 NOTE — Assessment & Plan Note (Addendum)
Improved. B12 and folate are normal.

## 2023-09-29 NOTE — Assessment & Plan Note (Signed)
  General Health Maintenance -Encourage 30 minutes of sustained cardio 5 days a week. -Schedule eye and dental exams. -Return for annual physical in 1 year, or sooner if any issues arise.  Tetanus vaccine last administered in 2007. -Administer Tetanus vaccine today.

## 2023-09-29 NOTE — Patient Instructions (Signed)
VISIT SUMMARY:  Today, we conducted your annual physical exam and discussed several health concerns you have been experiencing. We reviewed your symptoms and made a plan to address each issue.  YOUR PLAN:  -CONSTIPATION: Constipation is when you have infrequent or difficult bowel movements. To help with this, start taking fiber gummies and increase your fluid intake. If you don't see improvement, you can add MiraLAX once daily, starting with a tablespoon and adjusting as needed.  -NUMBNESS IN LEGS: You mentioned experiencing numbness in your legs, which has improved since your last visit. Since your B12 levels are normal, we will continue to monitor this issue.  -ALLERGIC RHINITIS: Allergic rhinitis is inflammation in the nose caused by allergies, leading to symptoms like mucus buildup. Since Flonase alone hasn't been effective, add Zyrtec to your regimen.  -RECURRENT YEAST INFECTIONS: Yeast infections are caused by an overgrowth of yeast in the body. You had a yeast infection after your last menstrual cycle. We will continue to monitor this, and if it becomes recurrent, we may need to do further evaluation.  -IMMUNIZATIONS: Your last tetanus vaccine was in 2007, so we administered a tetanus vaccine today to keep you up to date.  -HYPERLIPIDEMIA: Hyperlipidemia is when you have high levels of fats in your blood, which can increase your risk of heart disease. We have ordered a lipid panel to check your cholesterol levels.  -GENERAL HEALTH MAINTENANCE: For your overall health, aim for 30 minutes of sustained cardio exercise 5 days a week. Also, schedule your eye and dental exams. We will see you again for your annual physical in one year, or sooner if any issues arise.  INSTRUCTIONS:  Please follow up with the following: 1. Start taking fiber gummies and increase your fluid intake for constipation. If no improvement, add MiraLAX once daily. 2. Add Zyrtec to your regimen for allergic rhinitis. 3.  Schedule your eye and dental exams. 4. Return for your annual physical in one year, or sooner if any issues arise.

## 2023-09-29 NOTE — Assessment & Plan Note (Signed)
  Reports hard, pebble-like stools despite dietary changes. -Start fiber gummies and increase fluid intake. -If no improvement, add MiraLAX once daily, starting with a tablespoon and adjust as needed.

## 2023-09-29 NOTE — Assessment & Plan Note (Signed)
Unchanged, add back zyrtec to flonase.

## 2023-09-30 ENCOUNTER — Telehealth: Payer: Self-pay | Admitting: Family

## 2023-09-30 ENCOUNTER — Encounter: Payer: Self-pay | Admitting: Family

## 2023-09-30 MED ORDER — VITAMIN D3 75 MCG (3000 UT) PO TABS: 1.0000 | ORAL_TABLET | Freq: Every day | ORAL | Status: AC

## 2023-09-30 NOTE — Telephone Encounter (Signed)
See mychart.

## 2023-10-11 ENCOUNTER — Telehealth (INDEPENDENT_AMBULATORY_CARE_PROVIDER_SITE_OTHER): Payer: Commercial Managed Care - HMO | Admitting: Family

## 2023-10-11 DIAGNOSIS — F4322 Adjustment disorder with anxiety: Secondary | ICD-10-CM | POA: Diagnosis not present

## 2023-10-11 DIAGNOSIS — E538 Deficiency of other specified B group vitamins: Secondary | ICD-10-CM | POA: Diagnosis not present

## 2023-10-11 MED ORDER — CYANOCOBALAMIN 1000 MCG/ML IJ SOLN
1000.0000 ug | Freq: Once | INTRAMUSCULAR | Status: DC
  Administered 2023-10-11: 1000 ug via INTRAMUSCULAR
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
## 2023-10-12 DIAGNOSIS — E538 Deficiency of other specified B group vitamins: Secondary | ICD-10-CM | POA: Insufficient documentation

## 2023-10-12 DIAGNOSIS — F4322 Adjustment disorder with anxiety: Secondary | ICD-10-CM | POA: Insufficient documentation
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
## 2023-10-21 NOTE — Addendum Note (Signed)
 Addended by: Wilford Corner on: 10/21/2023 03:17 PM   Modules accepted: Orders

## 2023-10-25 ENCOUNTER — Encounter: Payer: Self-pay | Admitting: Family

## 2023-11-05 ENCOUNTER — Encounter: Payer: Self-pay | Admitting: Family

## 2023-11-05 DIAGNOSIS — Z0279 Encounter for issue of other medical certificate: Secondary | ICD-10-CM

## 2023-12-06 ENCOUNTER — Encounter: Payer: Self-pay | Admitting: Family

## 2023-12-16 ENCOUNTER — Telehealth: Payer: Self-pay | Admitting: Family

## 2023-12-16 NOTE — Telephone Encounter (Signed)
 Pt dropped off document to be filled out by provider (FMLA paperwork - 4 pages) Pt would like to have document faxed to [phone] and also call pt to pick up copy, Pt tel [phone]. Document put at front office tray under providers name.

## 2023-12-21 NOTE — Telephone Encounter (Signed)
Forms placed in providers office to be filled out.

## 2024-01-13 NOTE — Telephone Encounter (Signed)
 Copied from CRM [id number]. Topic: General - Other >> Jan 13, 2024  4:06 PM [employer] wrote: Reason for CRM: Patient called stating that her FMLA paperwork was ready a few weeks ago but she has not picked it up. She would like call back to know if it's still available for pickup.

## 2024-09-15 NOTE — Telephone Encounter (Signed)
 Please assist with scheduling dating/viability ultrasound for patient.   LMP: unknown  NOB/Nurse Intake: 2/3 Location Preference: [place]

## 2024-09-19 NOTE — Telephone Encounter (Signed)
 Left vm for patient to return call to confirm u/s.

## 2024-09-29 ENCOUNTER — Encounter: Payer: Commercial Managed Care - HMO | Admitting: Family

## 2024-11-03 ENCOUNTER — Encounter: Payer: Self-pay | Admitting: Family
